# Patient Record
Sex: Male | Born: 1960
Health system: Southern US, Community
[De-identification: ages and names within clinical notes are randomized; demographics above are authoritative.]

## PROBLEM LIST (undated history)

## (undated) DIAGNOSIS — G35D Multiple sclerosis, unspecified: Secondary | ICD-10-CM

## (undated) DIAGNOSIS — F1010 Alcohol abuse, uncomplicated: Secondary | ICD-10-CM

## (undated) DIAGNOSIS — F1910 Other psychoactive substance abuse, uncomplicated: Secondary | ICD-10-CM

## (undated) DIAGNOSIS — G35 Multiple sclerosis: Secondary | ICD-10-CM

## (undated) DIAGNOSIS — G709 Myoneural disorder, unspecified: Secondary | ICD-10-CM

## (undated) HISTORY — DX: Myoneural disorder, unspecified: G70.9

## (undated) HISTORY — PX: BACK SURGERY: SHX140

---

## 2012-10-27 ENCOUNTER — Emergency Department (INDEPENDENT_AMBULATORY_CARE_PROVIDER_SITE_OTHER)
Admission: EM | Admit: 2012-10-27 | Discharge: 2012-10-27 | Disposition: A | Discharge status: Home / Self Care | Payer: Self-pay | Source: Home / Self Care

## 2012-10-27 ENCOUNTER — Encounter (HOSPITAL_COMMUNITY): Payer: Self-pay

## 2012-10-27 NOTE — ED Notes (Signed)
C/o 1 week duration of ED; denies STD concerns or pain

## 2012-10-27 NOTE — ED Provider Notes (Signed)
Medical screening examination/treatment/procedure(s) were performed by non-physician practitioner and as supervising physician I was immediately available for consultation/collaboration.  Raynald Blend, MD 10/27/12 1547

## 2012-10-27 NOTE — ED Provider Notes (Signed)
History     CSN: [id number]  Arrival date & time 10/27/12  1010   None     Chief Complaint  Patient presents with  . Erectile Dysfunction    (Consider location/radiation/quality/duration/timing/severity/associated sxs/prior treatment) HPI Comments: 52 year old male presents with a complaint erectile dysfunction for one week. Is a rather sudden onset with an unknown etiology. A little over a week ago he was experiencing genital pain but that has since abated. He denies urethral discharge, pain, swelling or other urogenital problems. He states that the erectile dysfunction is just affecting his marriage. It is noted that one risk factor is smoking.   History reviewed. No pertinent past medical history.  History reviewed. No pertinent past surgical history.  History reviewed. No pertinent family history.  History  Substance Use Topics  . Smoking status: Not on file  . Smokeless tobacco: Not on file  . Alcohol Use: Not on file      Review of Systems  All other systems reviewed and are negative.    Allergies  Review of patient's allergies indicates no known allergies.  Home Medications  No current outpatient prescriptions on file.  BP 128/74  Pulse 98  Temp(Src) 98.2 F (36.8 C) (Oral)  Resp 20  SpO2 98%  Physical Exam  Constitutional: He is oriented to person, place, and time. He appears well-developed and well-nourished. No distress.  Eyes: EOM are normal.  Neck: Normal range of motion. Neck supple.  Cardiovascular: Normal rate.   Pulmonary/Chest: Effort normal. No respiratory distress.  Neurological: He is alert and oriented to person, place, and time.  Skin: Skin is warm and dry.  Psychiatric: He has a normal mood and affect.    ED Course  Procedures (including critical care time)  Labs Reviewed - No data to display No results found.   1. Male erectile dysfunction       MDM  The patient is discharged in stable condition. Since this condition  started rather suddenly approximately one week ago likely there could be a psychological component to this. In addition he is a smoker and is may also contribute to the etiology of his ED. It is explained that he would need further workup including a more in-depth history, physical exam and possible blood work to determine etiology. I have advised him to obtain a primary care doctor to perform these functions and evaluate his condition and etiology. They also prescribe medications for him as well. We will not be able to prescribe the medications at this time as the risk outweighs the benefits without having the above information and evaluation.         Hayden Rasmussen, NP 10/27/12 1116

## 2016-01-28 ENCOUNTER — Encounter (HOSPITAL_COMMUNITY): Payer: Self-pay | Admitting: Emergency Medicine

## 2016-01-28 DIAGNOSIS — F17210 Nicotine dependence, cigarettes, uncomplicated: Secondary | ICD-10-CM | POA: Insufficient documentation

## 2016-01-28 DIAGNOSIS — F1420 Cocaine dependence, uncomplicated: Secondary | ICD-10-CM | POA: Insufficient documentation

## 2016-01-28 DIAGNOSIS — G35 Multiple sclerosis: Secondary | ICD-10-CM | POA: Insufficient documentation

## 2016-01-28 DIAGNOSIS — Z79899 Other long term (current) drug therapy: Secondary | ICD-10-CM | POA: Insufficient documentation

## 2016-01-28 LAB — CBC
HEMATOCRIT: 42.2 % (ref 39.0–52.0)
HEMOGLOBIN: 13.4 g/dL (ref 13.0–17.0)
MCH: 27.2 pg (ref 26.0–34.0)
MCHC: 31.8 g/dL (ref 30.0–36.0)
MCV: 85.8 fL (ref 78.0–100.0)
Platelets: 321 10*3/uL (ref 150–400)
RBC: 4.92 MIL/uL (ref 4.22–5.81)
RDW: 14.1 % (ref 11.5–15.5)
WBC: 15.5 10*3/uL — AB (ref 4.0–10.5)

## 2016-01-28 LAB — RAPID URINE DRUG SCREEN, HOSP PERFORMED
Amphetamines: NOT DETECTED
Barbiturates: NOT DETECTED
Benzodiazepines: NOT DETECTED
Cocaine: POSITIVE — AB
Opiates: NOT DETECTED
Tetrahydrocannabinol: NOT DETECTED

## 2016-01-28 LAB — COMPREHENSIVE METABOLIC PANEL
ALBUMIN: 3.5 g/dL (ref 3.5–5.0)
ALT: 19 U/L (ref 17–63)
ANION GAP: 7 (ref 5–15)
AST: 27 U/L (ref 15–41)
Alkaline Phosphatase: 56 U/L (ref 38–126)
BUN: 15 mg/dL (ref 6–20)
CHLORIDE: 104 mmol/L (ref 101–111)
CO2: 26 mmol/L (ref 22–32)
Calcium: 9.3 mg/dL (ref 8.9–10.3)
Creatinine, Ser: 1.05 mg/dL (ref 0.61–1.24)
Glucose, Bld: 160 mg/dL — ABNORMAL HIGH (ref 65–99)
POTASSIUM: 3.9 mmol/L (ref 3.5–5.1)
SODIUM: 137 mmol/L (ref 135–145)
Total Bilirubin: 0.3 mg/dL (ref 0.3–1.2)
Total Protein: 6.4 g/dL — ABNORMAL LOW (ref 6.5–8.1)

## 2016-01-28 LAB — ETHANOL

## 2016-01-28 NOTE — ED Notes (Signed)
Pt here for detox off of cocaine. Pt denies SI/HI thoughts. Pts last use today. Pt alert x4. NAD at this time.

## 2016-01-29 ENCOUNTER — Encounter (HOSPITAL_COMMUNITY): Payer: Self-pay | Admitting: Emergency Medicine

## 2016-01-29 ENCOUNTER — Emergency Department (HOSPITAL_COMMUNITY)
Admission: EM | Admit: 2016-01-29 | Discharge: 2016-01-29 | Disposition: A | Discharge status: Home / Self Care | Payer: Self-pay | Attending: Emergency Medicine | Admitting: Emergency Medicine

## 2016-01-29 ENCOUNTER — Ambulatory Visit (HOSPITAL_COMMUNITY): Admission: RE | Admit: 2016-01-29 | Payer: Self-pay | Source: Home / Self Care | Admitting: Psychiatry

## 2016-01-29 DIAGNOSIS — F1420 Cocaine dependence, uncomplicated: Secondary | ICD-10-CM

## 2016-01-29 DIAGNOSIS — F17210 Nicotine dependence, cigarettes, uncomplicated: Secondary | ICD-10-CM | POA: Insufficient documentation

## 2016-01-29 DIAGNOSIS — Z79899 Other long term (current) drug therapy: Secondary | ICD-10-CM | POA: Insufficient documentation

## 2016-01-29 DIAGNOSIS — F1010 Alcohol abuse, uncomplicated: Secondary | ICD-10-CM | POA: Insufficient documentation

## 2016-01-29 DIAGNOSIS — F1410 Cocaine abuse, uncomplicated: Secondary | ICD-10-CM | POA: Insufficient documentation

## 2016-01-29 HISTORY — DX: Alcohol abuse, uncomplicated: F10.10

## 2016-01-29 HISTORY — DX: Multiple sclerosis: G35

## 2016-01-29 HISTORY — DX: Other psychoactive substance abuse, uncomplicated: F19.10

## 2016-01-29 HISTORY — DX: Multiple sclerosis, unspecified: G35.D

## 2016-01-29 MED ORDER — CHLORDIAZEPOXIDE HCL 25 MG PO CAPS: ORAL_CAPSULE | ORAL | Status: DC

## 2016-01-29 NOTE — Discharge Instructions (Signed)
Community Resource Guide Outpatient Counseling/Substance Abuse Adult °The [employer]’s “211” is a great source of information about community services available.  Access by dialing 2-1-1 from anywhere in [place], or by website -  www.nc211.org.  ° °Other Local Resources (Updated 09/2015) ° °Crisis Hotlines °  °Services  ° °  °Area Served  °[facility] • Crisis Hotline, available 24 hours a day, 7 days a week: 800-939-5911 [place], [place]  ° [facility] • Crisis Hotline, available 24 hours a day, 7 days a week: 866-275-9552 [place], [place]  °[facility] • Suicide Prevention Hotline, available 24 hours a day, 7 days a week: 800-273-8255 [place], [place]  °[facility] ° • Crisis Hotline, available 24 hours a day, 7 days a week: 336-676-6840 [place], [place] °  °[facility] • Crisis Hotline, available 24 hours a day, 7 days a week: 800-256-2452 All °  °[facility] • Crisis Hotline, available 24 hours a day, 7 days a week: 877-626-1772 [place]  ° °Other Local Resources (Updated 09/2015) ° °Outpatient Counseling/ Substance Abuse Programs  °Services  ° °  °Address and Phone Number  °[facility] ° • Options include Individual counseling, group counseling, intensive outpatient program (several hours a day, several days a week) °• Offers depression assessments °• Provides methadone maintenance program 336-333-6860 °[address], [address] °[place], [place] [address] °  °[facility] ° • Offers partial hospitalization/day treatment and DUI/DWI programs °• Accepts Medicare, private insurance 336-299-4655 °[address] °[place], [place] [address]  °[facility] ° ° • Services include intensive outpatient program (several hours a day, several days a week), outpatient treatment, DUI/DWI services, family education °• Also has some services specifically for Veterans °• Offers transitional housing   [phone] °[address] °[place], [place] [address] °  °  ° [facility] • Accepts Medicare, private pay, and private insurance 336-272-0855 °[address] °[place], [place] [address]  °[facility] • Services include individual counseling, substance abuse intensive outpatient program (several hours a day, several days a week), day treatment °• Accepts Medicare, Medicaid, private insurance 336-271-5888 °[address] °[place], [place] [address]  °[facility] ° • Offers substance abuse intensive outpatient program (several hours a day, several days a week), partial hospitalization program 336-832-9800 °[address] °[place], [place] [address] ° °336-349-4454 °[address] °[place], [place] [address] ° °[phone] °[address] °[place], [place] [address] ° °[phone] °[address] °[place], [place] [address]  °[facility] • Individual counseling only °• Accepts private insurance only 336-292-1510 °[address] °[place], [place] [address]  °[facility] • Methadone maintenance program 800-805-6989 °[address] °[place], [place] [address]  °[facility] • Walk-In Clinic providing substance abuse and mental health counseling °• Accepts Medicaid, Medicare, private insurance °• Offers sliding scale for uninsured 336-342-8316 °[address] °[place], [place]   °[facility] • Offers individual counseling, and intensive in-home services 336-347-7415 °[address] °[place], [place] [address]  °[facility] • Offers individual counseling, family counseling, group therapy, domestic violence counseling, consumer credit counseling °• Accepts Medicare, Medicaid, private insurance °• Offers sliding scale for uninsured 336-387-6161 °[address] °,  [address] ° °336-889-6161 °[place], [address] °[place],  [address]  °[facility] • Offers individual, family   and group counseling °• 3 locations - [place], [place], and [place] ° 336-899-8800 ° °[address] °[place], [place] [address] ° °[address] °[place], [place] [address] ° °[address] °[place], [place] [address]  °[facility]  ° • Offers psychiatric assessment, 8-week Intensive Outpatient Program (several hours a day, several times a week, daytime or evenings), early recovery group, family Program, medication management °• Private pay or private insurance only 336 -[phone], or  °800-659-3381 °[address] °[place], [place] [address]  °[facility] • Offers individual, couples and family counseling °• Accepts Medicaid, private insurance, and sliding scale for uninsured 336-542-2076 °[address] °[place], [place] [address]  °David Fuller, [facility] • Individual counseling °• Private insurance 336-852-4051 °[address] °[place], [place] [address]  °[facility] ° • Offers assessment, substance abuse treatment, and behavioral health treatment 336-878-6098 °[address] °[place], [place] [address]  °[facility] • Individual counseling °• Accepts private insurance 336-272-1972 °[address] °[place], [place] [address]  ° [facility] • Individual counseling °• Accepts Medicare, private insurance 336-547-1574 °[address] °[place], [place] [address]  °[facility]  ° • Offers intensive outpatient program (several hours a day, several times a week) °• Private pay, private insurance [phone] °[address] °[place], [place]  °[facility] • Individual counseling °• Medicare, private insurance 336-505-9494 °[address] °[place], [place] [address]  °[facility]  ° • Offers intensive outpatient program (several hours a day, several times a week) and partial hospitalization  program 336-794-3550 °[address] °[place], [place] [address]  °Parrish McKinney, MD • Individual counseling 336-282-1251 °[address] °[place], [place] [address]  °[facility] • Offers Christian counseling to individuals, couples, and families °• Accepts Medicare and private insurance; offers sliding scale for uninsured 336-288-1484 °[address] °[place], [place] [address]  °[facility] • Christian counseling 336-542-2060 °[address] °[place], [place] [address]  °[facility] ° • Offers crisis counseling, individual counseling, group therapy, in-home therapy, domestic violence services, day treatment, DWI services, Community Support Team (CST), Assertive Community Treatment Team (ACTT), substance abuse Intensive Outpatient Program (several hours a day, several times a week) °• 2 locations - [place] and [place] [phone] °[address] °[place], [place] [address] ° °336-694-1777 °[address] °[place], [place] [address]  °[facility]  ° ° • Individual counseling and group therapy °• Accepts private insurance, Medicare, Medicaid 336-379-7146 °[address] °[place], [place]  °[facility] • Offers individual and family counseling °• Offers LGBTQ services °• Accepts private insurance and private pay 336-288-9190 °[address] °[place], [place] [address]  °[facility]  ° • Offers individual counseling, group therapy, and outpatient detox °• Accepts private insurance 336-389-1413 °[address] °[place], [place]  °[facility] • Individual counseling °• Accepts Medicare, private insurance 336-632-3505 °[address] °[place], [place] [address]  °[facility] • Individual counseling °• Accepts Medicare, private insurance 336-570-0104 °[address] °, [place] [address]  °[facility] ° • Offers substance abuse  Intensive Outpatient Program (several hours a day, several times a week) [phone], or °[phone] °, [place]  ° °

## 2016-01-29 NOTE — Discharge Instructions (Signed)
Finding Treatment for Addiction WHAT IS ADDICTION? Addiction is a complex disease of the brain. It causes an uncontrollable (compulsive) need for a substance. You can be addicted to alcohol, illegal drugs, or prescription medicines such as painkillers. Addiction can also be a behavior, like gambling or shopping. The need for the drug or activity can become so strong that you think about it all the time. You can also become physically dependent on a substance. Addiction can change the way your brain works. Because of these changes, getting more of whatever you are addicted to becomes the most important thing to you and feels better than other activities or relationships. Addiction can lead to changes in health, behavior, emotions, relationships, and choices that affect you and everyone around you. HOW DO I KNOW IF I NEED TREATMENT FOR ADDICTION? Addiction is a progressive disease. Without treatment, addiction can get worse. Living with addiction puts you at higher risk for injury, poor health, lost employment, loss of money, and even death. You might need treatment for addiction if:  You have tried to stop or cut down, but you cannot.  Your addiction is causing physical health problems.  You find it annoying that your friends and family are concerned about your alcohol or substance use.  You feel guilty about substance abuse or a compulsive behavior.  You have lied or tried to hide your addiction.  You need a particular substance or activity to start your day or to calm down.  You are getting in trouble at school, work, home, or with the police.  You have done something illegal to support your addiction.  You are running out of money because of your addiction.  You have no time for anything other than your addiction. WHAT TYPES OF TREATMENT ARE AVAILABLE? The treatment program that is right for you will depend on many factors, including the type of addiction you have. Treatment programs  can be outpatient or inpatient. In an outpatient program, you live at home and go to work or school, but you also go to a clinic for treatment. With an inpatient program, you live and sleep at the program facility during treatment. After treatment, you might need a plan for support during recovery. Other treatment options include:   Medicine.  Some addictions may be treated with prescription medicines.  You might also need medicine to treat anxiety or depression.  Counseling and behavior therapy. Therapy can help individuals and families behave in healthier ways and relate more effectively.  Support groups. Confidential group therapy, such as a 12-step program, can help individuals and families during treatment and recovery. No single type of program is right for everyone. Many treatment programs involve a combination of education, counseling, and a 12-step, spiritually-based approach. Some treatment programs are government sponsored. They are geared for patients who do not have private insurance. Treatment programs can vary in many respects, such as:  Cost and types of insurance that are accepted.  Types of on-site medical services that are offered.  Length of stay, setting, and size.  Overall philosophy of treatment. WHAT SHOULD I CONSIDER WHEN SELECTING A TREATMENT PROGRAM? It is important to think about your individual requirements when selecting a treatment program. There are a number of things to consider, such as:  If the program is certified by the appropriate government agency. Even private programs must be certified and employ certified professionals.  If the program is covered by your insurance. If finances are a concern, the first call you should make  is to [employer], if you have health insurance. Ask for a list of treatment programs that are in your network, and confirm any copayments and deductibles that you may have to pay.  If you do not have insurance, or if  you choose to attend a program that does not accept your insurance, discuss whether a payment plan can be set up.  If treatment is available in languages other than English, if needed.  If the program offers detoxification treatment, if needed.  If 12-step meetings are held at the center or if transport is available for patients to attend meetings at other locations.  If the program is professional, organized, and clean.  If the program meets all of your needs, including physical and cultural needs.  If the facility offers specific treatment for your particular addiction.  If support continues to be offered after you have left the program.  If your treatment plan is continually looked at to make sure you are receiving the right treatment at the right time.  If mental health counseling is part of your treatment.  If medicine is included in treatment, if needed.  If your family is included in your treatment plan and if support is offered to them throughout the treatment process.  How the treatment works to prevent relapse. WHERE ELSE CAN I GET HELP?  Your health care provider. Ask him or her to help you find addiction treatment. These discussions are confidential.  The [employer] on Alcoholism and Drug Dependence (NCADD). This group has information about treatment centers and programs for people who have an addiction and for family members.  The telephone number is 1-800-NCA-CALL (219-875-7242).  The website is https://ncadd.org/about-ncadd/our-affiliates  The Substance Abuse and Mental Health Services Administration [facility]). This group will help you find publicly funded treatment centers, help hotlines, and counseling services near you.  The telephone number is 1-800-662-HELP ((336) 470-9655).  The website is www.findtreatment.RockToxic.pl In countries outside of the [place]. and [place], look in [employer] for contact information for services in your area.   This  information is not intended to replace advice given to you by your health care provider. Make sure you discuss any questions you have with your health care provider.   Document Released: 07/24/2005 Document Revised: 05/16/2015 Document Reviewed: 06/13/2014 Elsevier Interactive Patient Education 2016 [employer]. Substance Abuse Treatment Programs  Intensive Outpatient Programs [facility] Services     [address]      [place], [place]                   [phone]       [facility] [address] [place] [address] [place], [place] [phone]  [facility]     (Inpatient and outpatient)     [address]           [phone]    [facility] [phone] (Suboxone and Methadone)  [address]      [place], [place] [address]      [phone]       [address] [place], [place] [phone]  [facility] (Outpatient/Inpatient, [facility])    (insurance only) [phone]             [facility] (Groups & Residential) [place], [place] [phone]     [facility]     [address]     [place], [place]      [phone]  [facility] (for caregivers and family) [address] [place], [place] [phone]      Residential Treatment Programs [facility]      [address] [place], [place] [address]  [phone]       [facility] [address] [place], [place] [address] [phone]  [facility] [place]        [phone]       [facility] [phone]  [facility] ([facility]             [address]                                         [place], [place]                                                [phone] or [phone]                               [facility] [address] [place], [address] [phone]  [facility]    [address]      [place],  [place]     [phone]       [facility] [address] [place], [place] [phone]  [facility]   [address] [place], [place] [address]     [phone]      Admissions: 8am-3pm M-F  [facility] ([facility]) [address] [place], [place] [phone]  [facility]: Residential Program [phone] Days)   [place], [place]      [phone] or [phone]     [facility] [place], [place] (Walk in Hours over the weekend or by referral)  [facility] [address] [place], [place], [place] [address] [phone]  Crisis Mobile: [facility]:  [phone] (for crisis response 24 hours a day) [facility] Hotline:      [phone] Outpatient Psychiatry and Counseling  [facility]: Mobile Crisis Management 24 hours:  [phone]  [facility] of the [employer] sliding scale fee and walk in schedule: M-F 8am-12pm/1pm-3pm [address]  [place], [place] [address] [phone]  [facility] [address] [place], [place] [address] [phone]  [facility] ([facility] [employer])- new patient walk-in appointments available Monday - Friday 8am -3pm.          [address] [place], [place] [address] [phone] or crisis line- [phone]  [facility] Services/ Intensive Outpatient Therapy Program [address] [place], [place] [address] [phone]  [facility]                  Crisis Services      [phone] [address]     [place], [place] [address]                 [facility]   [facility] [phone]. [address] [place], [place] [address]   [facility] of Care          [address],  [place], [place] [address]       [phone]  [facility] [address] [place], [place] [address] [phone]  [facility]    [address]    [place], [place] [address]     [phone]       Andee Poles, MD     [address]     [place] [address]     [phone]       [facility] [address] [place] [address]  [facility]     [address] [place], [place]      [phone]       [facility] Eulogio Ditch, MD [address] [place], [place] [address] [phone]  [facility]     [address]     [place], [place] [address]     [phone]       [facility] [address] [place], [place] [address] [phone] Resources for Temporary Residential Assistance/Crisis Centers  DAY CENTERS [facility]) M-F 8am-3pm   [address] [place], [place] [address]   [phone] Services include: laundry, barbering, support groups, case management, phone  & computer access, showers, AA/NA mtgs, mental health/substance abuse nurse, job skills class, disability information, VA assistance, spiritual classes, etc.   HOMELESS SHELTERS  [facility]     [facility]   [address] [place]     [phone]              [employer] (women and children)       [address] [place], [place] [address] [phone] [email or web address] for application and process Application Required  [facility]   [address]    [place] [address]     [phone]                    [facility] [address] [place], [place] [address] [phone] application appt.) Application Required  [facility] (women only)    [address]     [place], [place] [address]     [phone]      Intake starts 6pm daily Need valid ID, SSC, & Police report [employer] [address] [place], [place] [phone] Application Required  [employer] (men only)     [address]      [place], [place]     [phone]       [facility] (Pregnant women only) [address] [place], [place] [phone]  [facility]      [address]      [place], [place] [address]     [phone]             [facility] [address] [place], [place] [phone] 90 day commitment/SA/Application process  [facility](men only)     [address]     [place], [place]     [phone]       Check-in at [facility] of [facility] [address] [place], [place] [address] [phone] Men/Women/Women and Children must be there by 7 pm  [facility] [place], [place] [phone]  Stimulant Use Disorder-Cocaine Cocaine is one of a group of powerful drugs called stimulants. Cocaine has medical uses for stopping nosebleeds and for pain control before minor nose or dental surgery. However, cocaine is misused because of the effects that it produces. These effects include:   A feeling of extreme pleasure.  Alertness.  High energy. Common street names for cocaine include coke, crack, blow, snow, and nose candy. Cocaine is snorted, dissolved in water and injected, or smoked.  Stimulants are addictive because they activate regions of the brain that produce both the pleasurable sensation of "reward" and psychological dependence. Together, these actions account for loss of control and the rapid development of drug dependence. This means you become ill without the drug (withdrawal) and need to keep using it to function.  Stimulant use disorder is use of stimulants that disrupts your daily life. It disrupts relationships with family and friends and how you do your job. Cocaine increases your blood pressure and heart rate. It can cause a heart  attack or stroke. Cocaine can also cause death from irregular heart rate or seizures. SYMPTOMS Symptoms of stimulant use disorder with cocaine include:  Use of cocaine in larger amounts or over a longer period of time than intended.  Unsuccessful attempts to cut down or control cocaine use.  A lot of time spent obtaining, using, or recovering from the effects of cocaine.  A strong desire or urge to use cocaine (craving).  Continued use of cocaine in spite of major problems at work, school, or home because of use.  Continued use of cocaine in spite of relationship problems because of use.  Giving up or cutting down on important life activities because of cocaine use.  Use of cocaine over and over in situations when it is physically hazardous, such as driving a car.  Continued use of cocaine in spite of a physical problem that is likely related to use. Physical problems can include:  Malnutrition.  Nosebleeds.  Chest pain.  High blood pressure.  A hole that develops between the part of your nose that separates your nostrils (perforated nasal septum).  Lung and kidney damage.  Continued use of cocaine in spite of a mental problem that is likely related to use. Mental problems can include:  Schizophrenia-like symptoms.  Depression.  Bipolar mood swings.  Anxiety.  Sleep problems.  Need to use more and more cocaine to get the same effect, or lessened effect over time with use of the same amount of cocaine (tolerance).  Having withdrawal symptoms when cocaine use is stopped, or using cocaine to reduce or avoid withdrawal symptoms. Withdrawal symptoms include:  Depressed or irritable mood.  Low energy or restlessness.  Bad dreams.  Poor or excessive sleep.  Increased appetite. DIAGNOSIS Stimulant use disorder is diagnosed by your health care provider. You may be asked questions about your cocaine use and how it affects your life. A physical exam may be done. A  drug screen may be ordered. You may be referred to a mental health professional. The diagnosis of stimulant use disorder requires at least two symptoms within 12 months. The type of stimulant use disorder depends on the number of signs and symptoms you have. The type may be:  Mild. Two or three signs and symptoms.  Moderate. Four or five signs and symptoms.  Severe. Six or more signs and symptoms. TREATMENT Treatment for stimulant use disorder is usually provided by mental health professionals with training in substance use disorders. The following options are  available:  Counseling or talk therapy. Talk therapy addresses the reasons you use cocaine and ways to keep you from using again. Goals of talk therapy include:  Identifying and avoiding triggers for use.  Handling cravings.  Replacing use with healthy activities.  Support groups. Support groups provide emotional support, advice, and guidance.  Medicine. Certain medicines may decrease cocaine cravings or withdrawal symptoms. HOME CARE INSTRUCTIONS  Take medicines only as directed by your health care provider.  Identify the people and activities that trigger your cocaine use and avoid them.  Keep all follow-up visits as directed by your health care provider. SEEK MEDICAL CARE IF:  Your symptoms get worse or you relapse.  You are not able to take medicines as directed. SEEK IMMEDIATE MEDICAL CARE IF:  You have serious thoughts about hurting yourself or others.  You have a seizure, chest pain, sudden weakness, or loss of speech or vision. FOR MORE INFORMATION  National Institute on Drug Abuse: http://www.price-smith.com/  Substance Abuse and Mental Health Services Administration: SkateOasis.com.pt   This information is not intended to replace advice given to you by your health care provider. Make sure you discuss any questions you have with your health care provider.   Document Released: 08/22/2000 Document Revised: 09/15/2014  Document Reviewed: 09/07/2013 Elsevier Interactive Patient Education [employer]  Results for orders placed or performed during the hospital encounter of 01/29/16  Comprehensive metabolic panel  Result Value Ref Range   Sodium 137 135 - 145 mmol/L   Potassium 3.9 3.5 - 5.1 mmol/L   Chloride 104 101 - 111 mmol/L   CO2 26 22 - 32 mmol/L   Glucose, Bld 160 (H) 65 - 99 mg/dL   BUN 15 6 - 20 mg/dL   Creatinine, Ser 1.61 0.61 - 1.24 mg/dL   Calcium 9.3 8.9 - 09.6 mg/dL   Total Protein 6.4 (L) 6.5 - 8.1 g/dL   Albumin 3.5 3.5 - 5.0 g/dL   AST 27 15 - 41 U/L   ALT 19 17 - 63 U/L   Alkaline Phosphatase 56 38 - 126 U/L   Total Bilirubin 0.3 0.3 - 1.2 mg/dL   GFR calc non Af Amer >60 >60 mL/min   GFR calc Af Amer >60 >60 mL/min   Anion gap 7 5 - 15  Ethanol  Result Value Ref Range   Alcohol, Ethyl (B) <5 <5 mg/dL  cbc  Result Value Ref Range   WBC 15.5 (H) 4.0 - 10.5 K/uL   RBC 4.92 4.22 - 5.81 MIL/uL   Hemoglobin 13.4 13.0 - 17.0 g/dL   HCT 04.5 40.9 - 81.1 %   MCV 85.8 78.0 - 100.0 fL   MCH 27.2 26.0 - 34.0 pg   MCHC 31.8 30.0 - 36.0 g/dL   RDW 91.4 78.2 - 95.6 %   Platelets 321 150 - 400 K/uL  Rapid urine drug screen (hospital performed)  Result Value Ref Range   Opiates NONE DETECTED NONE DETECTED   Cocaine POSITIVE (A) NONE DETECTED   Benzodiazepines NONE DETECTED NONE DETECTED   Amphetamines NONE DETECTED NONE DETECTED   Tetrahydrocannabinol NONE DETECTED NONE DETECTED   Barbiturates NONE DETECTED NONE DETECTED

## 2016-01-29 NOTE — ED Provider Notes (Signed)
CSN: [id number]     Arrival date & time 01/29/16  0957 History   First MD Initiated Contact with Patient 01/29/16 1114     Chief Complaint  Patient presents with  . Alcohol Problem  . Addiction Problem    pt is requesting detox      HPI Patient presents to the emergency department requesting detox from alcohol and cocaine.  He states his had long-term use of both.  His last use of both was yesterday.  No homicidal or suicidal thoughts.  No nausea vomiting.  Denies abdominal pain.  No other complaints.  Requesting detox.   Past Medical History  Diagnosis Date  . Multiple sclerosis (HCC)   . Substance abuse   . Alcohol abuse    History reviewed. No pertinent past surgical history. No family history on file. Social History  Substance Use Topics  . Smoking status: Current Every Day Smoker -- 0.50 packs/day    Types: Cigarettes  . Smokeless tobacco: None  . Alcohol Use: Yes    Review of Systems  All other systems reviewed and are negative.     Allergies  Review of patient's allergies indicates no known allergies.  Home Medications   Prior to Admission medications   Medication Sig Start Date End Date Taking? Authorizing Provider  amitriptyline (ELAVIL) 150 MG tablet Take 150 mg by mouth at bedtime.    Historical Provider, MD  baclofen (LIORESAL) 10 MG tablet Take 15 mg by mouth 2 (two) times daily.    Historical Provider, MD  chlordiazePOXIDE (LIBRIUM) 25 MG capsule  PO TID x 1D, then 25-50mg  PO BID X 1D, then 25-50mg  PO QD X 1D 01/29/16   Azalia Bilis, MD  Interferon Beta-1a (REBIF REBIDOSE TITRATION PACK) [phone] & 6X22 MCG SOAJ Inject 8.8 mcg into the skin See admin instructions. 3 times a week for 2 weeks then 22 mcg three times a week for 2 weeks    Historical Provider, MD  traZODone (DESYREL) 50 MG tablet Take 50 mg by mouth at bedtime as needed for sleep.    Historical Provider, MD   BP 134/73 mmHg  Pulse 64  Temp(Src) 98 F (36.7 C) (Oral)  Resp 18  SpO2  99% Physical Exam  Constitutional: He is oriented to person, place, and time. He appears well-developed and well-nourished.  HENT:  Head: Normocephalic.  Eyes: EOM are normal.  Neck: Normal range of motion.  Pulmonary/Chest: Effort normal.  Abdominal: He exhibits no distension.  Musculoskeletal: Normal range of motion.  Neurological: He is alert and oriented to person, place, and time.  Psychiatric: He has a normal mood and affect.  Nursing note and vitals reviewed.   ED Course  Procedures (including critical care time) Labs Review Labs Reviewed - No data to display  Imaging Review No results found. I have personally reviewed and evaluated these images and lab results as part of my medical decision-making.   EKG Interpretation None      MDM   Final diagnoses:  Cocaine abuse  Alcohol abuse    Vital signs are normal.  No signs of acute withdrawal.  Outpatient Librium and outpatient resources given.  Discharged home safely from the emergency department.  Medical screening examination completed.    Azalia Bilis, MD 01/29/16 1131

## 2016-01-29 NOTE — ED Notes (Addendum)
Pt is requesting detoxification from alcohol and cocaine.Stated last used at 0900 yesterday. Given information at [facility] this am. Pt stated that he was told to come to [facility] for detox.Pt is alert, oriented and cooperative

## 2016-01-29 NOTE — ED Notes (Addendum)
EDP at bedside. Pt stated that he will call [facility]

## 2016-01-29 NOTE — ED Notes (Signed)
Patient reports that he uses cocaine daily. Denies IV drug use. Reports that he has MS, and wants to detox from cocaine use.

## 2016-01-29 NOTE — ED Provider Notes (Signed)
CSN: [id number]     Arrival date & time 01/28/16  1555 History   First MD Initiated Contact with Patient 01/29/16 0147     Chief Complaint  Patient presents with  . Drug Problem     (Consider location/radiation/quality/duration/timing/severity/associated sxs/prior Treatment) HPI Comments: Patient presents for assistance with cocaine dependency. He denies being suicidal or homicidal. No hallucinations. He states he has never tried to quit before and feels he needs help or he will continue to use. No physical complaints.   Patient is a 55 y.o. male presenting with drug problem. The history is provided by the patient. No language interpreter was used.  Drug Problem This is a chronic problem. Pertinent negatives include no chills or fever.    Past Medical History  Diagnosis Date  . Multiple sclerosis (HCC)    No past surgical history on file. No family history on file. Social History  Substance Use Topics  . Smoking status: Current Every Day Smoker -- 0.50 packs/day    Types: Cigarettes  . Smokeless tobacco: None  . Alcohol Use: Yes    Review of Systems  Constitutional: Negative for fever and chills.  HENT: Negative.   Respiratory: Negative.   Cardiovascular: Negative.   Gastrointestinal: Negative.   Musculoskeletal: Negative.   Skin: Negative.   Neurological: Negative.       Allergies  Review of patient's allergies indicates no known allergies.  Home Medications   Prior to Admission medications   Medication Sig Start Date End Date Taking? Authorizing Provider  amitriptyline (ELAVIL) 150 MG tablet Take 150 mg by mouth at bedtime.   Yes Historical Provider, MD  baclofen (LIORESAL) 10 MG tablet Take 15 mg by mouth 2 (two) times daily.   Yes Historical Provider, MD  traZODone (DESYREL) 50 MG tablet Take 50 mg by mouth at bedtime as needed for sleep.   Yes Historical Provider, MD  Interferon Beta-1a (REBIF REBIDOSE TITRATION PACK) 6X8.8 & 6X22 MCG SOAJ Inject 8.8 mcg  into the skin See admin instructions. 3 times a week for 2 weeks then 22 mcg three times a week for 2 weeks    Historical Provider, MD   BP 105/69 mmHg  Pulse 76  Temp(Src) 98.3 F (36.8 C) (Oral)  Resp 20  SpO2 97% Physical Exam  Constitutional: He is oriented to person, place, and time. He appears well-developed and well-nourished.  HENT:  Head: Normocephalic.  Neck: Normal range of motion. Neck supple.  Cardiovascular: Normal rate and regular rhythm.   Pulmonary/Chest: Effort normal and breath sounds normal.  Abdominal: Soft. Bowel sounds are normal. There is no tenderness. There is no rebound and no guarding.  Musculoskeletal: Normal range of motion.  Neurological: He is alert and oriented to person, place, and time.  Skin: Skin is warm and dry. No rash noted.  Psychiatric: He has a normal mood and affect.    ED Course  Procedures (including critical care time) Labs Review Labs Reviewed  COMPREHENSIVE METABOLIC PANEL - Abnormal; Notable for the following:    Glucose, Bld 160 (*)    Total Protein 6.4 (*)    All other components within normal limits  CBC - Abnormal; Notable for the following:    WBC 15.5 (*)    All other components within normal limits  URINE RAPID DRUG SCREEN, HOSP PERFORMED - Abnormal; Notable for the following:    Cocaine POSITIVE (*)    All other components within normal limits  ETHANOL    Imaging Review No results found.  I have personally reviewed and evaluated these images and lab results as part of my medical decision-making.   EKG Interpretation None      MDM   Final diagnoses:  None    1. Cocaine dependence 2. History of MS  The patient presents for assistance in the treatment of cocaine dependence. He is not homicidal or suicidal. He reports he was sent here from [facility] for inpatient treatment. The patient was advised that there is no medical necessity for detox from cocaine. Called [facility] to discuss admission on behalf of the  patient. Admissions are not available until 8:00 am and no bed guaranteed. Patient advised. He will be able to board in ED until morning and will follow up with [facility] in the morning for possible admission for inpatient treatment.     Elpidio Anis, PA-C 01/29/16 3500  Derwood Kaplan, MD 01/29/16 [phone]

## 2016-03-05 DIAGNOSIS — G35D Multiple sclerosis, unspecified: Secondary | ICD-10-CM

## 2016-03-05 DIAGNOSIS — Z5900 Homelessness unspecified: Secondary | ICD-10-CM

## 2016-03-05 DIAGNOSIS — F32A Depression, unspecified: Secondary | ICD-10-CM

## 2016-03-05 DIAGNOSIS — F329 Major depressive disorder, single episode, unspecified: Secondary | ICD-10-CM

## 2016-03-05 DIAGNOSIS — G35 Multiple sclerosis: Secondary | ICD-10-CM

## 2016-03-06 NOTE — Congregational Nurse Program (Signed)
Congregational Nurse Program Note  Date of Encounter: 03/05/2016  Past Medical History: Past Medical History  Diagnosis Date  . Multiple sclerosis (HCC)   . Substance abuse   . Alcohol abuse     Encounter Details:     CNP Questionnaire - 03/06/16 0216    Patient Demographics   Is this a new or existing patient? New   Patient is considered a/an Not Applicable   Race African-American/Black   Patient Assistance   Location of Patient Assistance Not Applicable   Patient's financial/insurance status Self-Pay   Uninsured Patient Yes   Interventions Counseled to make appt. with provider   Patient referred to apply for the following financial assistance [facility];Medicaid   Transportation assistance Yes   Type of Assistance Bus Pass Given   Assistance securing medications No   Educational health offerings Chronic disease;Navigating the healthcare system;Interpersonal relationships;Behavioral health;Medications   Encounter Details   Primary purpose of visit Chronic Illness/Condition Visit;Education/Health Concerns;Spiritual Care/Support Visit;Navigating the Healthcare System   Was an Emergency Department visit averted? Not Applicable   Does patient have a medical provider? No   Patient referred to Establish PCP   Was a mental health screening completed? (GAINS tool) No   Does patient have dental issues? No   Does patient have vision issues? No   Does your patient have an abnormal blood pressure today? No   Since previous encounter, have you referred patient for abnormal blood pressure that resulted in a new diagnosis or medication change? No   Does your patient have an abnormal blood glucose today? No   Since previous encounter, have you referred patient for abnormal blood glucose that resulted in a new diagnosis or medication change? No   Was there a life-saving intervention made? No    Initial  Visit  For this  55 year old  Man ,stating he has MS  And is seen in  [place] for his  Speciality care. Was diagnosed  1 year ago he states and that has changed his life , states he speaks to groups  About what he has gone through and God  Still has him here. States he tried one time to commit suicide but God  Saved him .  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  Has a 55 year old  Soon , is still on probation ,has on a ankle monitor.  Has difficulty getting back  And forth to see  Specialist in [facility] ,would  Like to be referred to one in town.  Medications  For  MS through  MS support  Group, attends MS sessions to learn all about  Condition att a local church in town , nurse looked up support  Groups  And gave client another resource to contact. Referral made to obtain a primary care MD, is seen at  [facility] for therapy as he said having  MS  Has made him depressed. Nurse listed and offered support as needed. Has knowledge of his  Condition and is resourceful . Client feels some days he is unable to get up  And may need rest ,and when taking his medication needs a snack. Nurse will talk with case manager.  Client takes Elavil 150 mg  At  Night, Baclofen  10 mg  , Trazone   50 mg, Amitriptyline  HCL  50 mg  3  at night.. Client is  From [facility] , has lived in [place]  About  8 years. States  MS  Has orders him cooling  Vest  For  Pain.  Has eye  Problems  ,blurred  Vision.. Referral to [facility] for  Primary  Care, apply  For orange  Card, return to see nurse next week for follow  Up.  Bus  Tickets  Given to  Go to [employer]

## 2016-03-12 DIAGNOSIS — G35 Multiple sclerosis: Secondary | ICD-10-CM

## 2016-03-12 DIAGNOSIS — F329 Major depressive disorder, single episode, unspecified: Secondary | ICD-10-CM

## 2016-03-12 DIAGNOSIS — Z5900 Homelessness unspecified: Secondary | ICD-10-CM

## 2016-03-12 DIAGNOSIS — F32A Depression, unspecified: Secondary | ICD-10-CM

## 2016-03-12 NOTE — Congregational Nurse Program (Signed)
Congregational Nurse Program Note  Date of Encounter: 03/12/2016  Past Medical History: Past Medical History  Diagnosis Date  . Multiple sclerosis (HCC)   . Substance abuse   . Alcohol abuse     Encounter Details:     CNP Questionnaire - 03/12/16 1800    Patient Demographics   Is this a new or existing patient? Existing   Patient is considered a/an Not Applicable   Race African-American/Black   Patient Assistance   Location of Patient Assistance Not Applicable   Patient's financial/insurance status Self-Pay   Uninsured Patient Yes   Interventions Follow-up/Education/Support provided after completed appt.;Counseled to make appt. with provider   Patient referred to apply for the following financial assistance [facility]   Food insecurities addressed Not Applicable   Transportation assistance No   Assistance securing medications No   Educational health offerings Chronic disease;Medications;Navigating the healthcare system;Exercise/physical activity   Encounter Details   Primary purpose of visit Education/Health Concerns;Spiritual Care/Support Visit;Navigating the Healthcare System   Was an Emergency Department visit averted? Not Applicable   Does patient have a medical provider? Yes   Patient referred to Follow up with established PCP   Was a mental health screening completed? (GAINS tool) No   Does patient have dental issues? No   Does patient have vision issues? No   Was a vision referral made? No   Does your patient have an abnormal blood pressure today? No   Since previous encounter, have you referred patient for abnormal blood pressure that resulted in a new diagnosis or medication change? No   Does your patient have an abnormal blood glucose today? No   Since previous encounter, have you referred patient for abnormal blood glucose that resulted in a new diagnosis or medication change? No   Was there a life-saving intervention made? No    Client in today   After  Identifying  His  Primary  Care . [facility]  Will see  Client for  His  Primary  Care  Needs .Marland Kitchen A copy  Of a letter   From his  SW was placed in is  file. Appointment  For  Primary  Care  Is  04-16-16.  Client will  Not  Need to  Seek appointment  At  [facility], explained to  Client. Given  Places to  Go  An apply  For  [facility] card.  Will  Need  A letter  Stating he  Lives  Here . Will get  Case management  To  Print.

## 2016-04-22 DIAGNOSIS — G35 Multiple sclerosis: Secondary | ICD-10-CM

## 2016-04-22 DIAGNOSIS — Z5900 Homelessness unspecified: Secondary | ICD-10-CM

## 2016-04-22 NOTE — Congregational Nurse Program (Signed)
Congregational Nurse Program Note  Date of Encounter: 04/22/2016  Past Medical History: Past Medical History:  Diagnosis Date  . Alcohol abuse   . Multiple sclerosis (HCC)   . Substance abuse     Encounter Details:     CNP Questionnaire - 04/22/16 1923      Patient Demographics   Is this a new or existing patient? Existing   Patient is considered a/an Not Applicable   Race African-American/Black     Patient Assistance   Location of Patient Assistance Not Applicable   Patient's financial/insurance status Self-Pay   Uninsured Patient Yes   Interventions Follow-up/Education/Support provided after completed appt.   Patient referred to apply for the following financial assistance Not Applicable   Food insecurities addressed Not Applicable   Transportation assistance Yes   Type of Assistance Bus Pass Given   Educational health offerings Chronic disease     Encounter Details   Primary purpose of visit Spiritual Care/Support Visit;Education/Health Concerns   Was an Emergency Department visit averted? Not Applicable   Does patient have a medical provider? Yes   Patient referred to Follow up with established PCP   Was a mental health screening completed? (GAINS tool) No   Does patient have dental issues? No   Does patient have vision issues? No   Does your patient have an abnormal blood pressure today? No   Since previous encounter, have you referred patient for abnormal blood pressure that resulted in a new diagnosis or medication change? No   Does your patient have an abnormal blood glucose today? No   Since previous encounter, have you referred patient for abnormal blood glucose that resulted in a new diagnosis or medication change? No   Was there a life-saving intervention made? No     Client being discharged today to his apartment,brother assisting him with his move. Client states he ahs been blessed. Has two upcoming MD appointments. Ask if he could  Get bus passes and  nurse gave him passes for appointments.and wished him well.

## 2016-08-28 ENCOUNTER — Encounter (HOSPITAL_COMMUNITY): Payer: Self-pay | Admitting: Emergency Medicine

## 2016-08-28 ENCOUNTER — Ambulatory Visit (INDEPENDENT_AMBULATORY_CARE_PROVIDER_SITE_OTHER): Payer: Self-pay

## 2016-08-28 ENCOUNTER — Ambulatory Visit (HOSPITAL_COMMUNITY)
Admission: EM | Admit: 2016-08-28 | Discharge: 2016-08-28 | Disposition: A | Discharge status: Home / Self Care | Payer: Self-pay | Attending: Emergency Medicine | Admitting: Emergency Medicine

## 2016-08-28 DIAGNOSIS — M545 Low back pain, unspecified: Secondary | ICD-10-CM

## 2016-08-28 DIAGNOSIS — S300XXA Contusion of lower back and pelvis, initial encounter: Secondary | ICD-10-CM

## 2016-08-28 MED ORDER — KETOROLAC TROMETHAMINE 30 MG/ML IJ SOLN
30.0000 mg | Freq: Once | INTRAMUSCULAR | Status: AC
  Administered 2016-08-28: 30 mg via INTRAMUSCULAR

## 2016-08-28 MED ORDER — DEXAMETHASONE SODIUM PHOSPHATE 10 MG/ML IJ SOLN: 8.0000 mg | Freq: Once | INTRAMUSCULAR | Status: DC

## 2016-08-28 MED ORDER — DEXAMETHASONE SODIUM PHOSPHATE 10 MG/ML IJ SOLN
INTRAMUSCULAR | Status: AC
  Filled 2016-08-28: qty 1

## 2016-08-28 MED ORDER — DEXAMETHASONE SODIUM PHOSPHATE 10 MG/ML IJ SOLN
10.0000 mg | Freq: Once | INTRAMUSCULAR | Status: AC
  Administered 2016-08-28: 10 mg via INTRAMUSCULAR

## 2016-08-28 MED ORDER — CYCLOBENZAPRINE HCL 10 MG PO TABS: 10.0000 mg | ORAL_TABLET | Freq: Two times a day (BID) | ORAL | 0 refills | Status: DC | PRN

## 2016-08-28 MED ORDER — PREDNISONE 10 MG PO TABS: 20.0000 mg | ORAL_TABLET | Freq: Every day | ORAL | 0 refills | Status: DC

## 2016-08-28 MED ORDER — KETOROLAC TROMETHAMINE 30 MG/ML IJ SOLN
INTRAMUSCULAR | Status: AC
  Filled 2016-08-28: qty 1

## 2016-08-28 MED FILL — CYCLOBENZAPRINE 10 MG TAB: 10 | 10 days supply | Qty: 20 | Fill #0

## 2016-08-28 MED FILL — ?PREDNISONE 10 MG TABLET: 10 | 5 days supply | Qty: 10 | Fill #0

## 2016-08-28 NOTE — ED Triage Notes (Signed)
Pt reports he fell today going up stairs at home today around 1155 and inj his back  States hx of MS... Slow gait; cane present upon arrival  States he hit back of his head when he fell but denies LOC  Pt is sitting down crouched on a 15 degree angle... States he can not straighten up due to MS  Brother was able to help him quickly and bring him here  A&O x4... Talking in complete sentences... NAD

## 2016-08-28 NOTE — ED Provider Notes (Signed)
CSN: [id number]     Arrival date & time 08/28/16  1319 History   None    Chief Complaint  Patient presents with  . Fall  . Back Pain   (Consider location/radiation/quality/duration/timing/severity/associated sxs/prior Treatment) HPI: 55 yo male with H/O MS presented with CC of lower back pain secondary to fall and hitting on wooden steps. Pt leading forward, unable to sit up at 90 degrees secondary to pain.  No visible swelling/hematoma to scalp.  Pt A&Ox3. Speech clear. Answered all questions appropriately.  Pt has poor gait and co ordination due to MS and uses cane to walk. Past Medical History:  Diagnosis Date  . Alcohol abuse   . Multiple sclerosis (HCC)   . Substance abuse    History reviewed. No pertinent surgical history. History reviewed. No pertinent family history. Social History  Substance Use Topics  . Smoking status: Current Every Day Smoker    Packs/day: 0.50    Types: Cigarettes  . Smokeless tobacco: Never Used  . Alcohol use Yes    Review of Systems  Musculoskeletal: Positive for back pain and gait problem.    Allergies  Patient has no known allergies.  Home Medications   Prior to Admission medications   Medication Sig Start Date End Date Taking? Authorizing Provider  Interferon Beta-1a (REBIF REBIDOSE TITRATION PACK) 6X8.8 & 6X22 MCG SOAJ Inject 8.8 mcg into the skin See admin instructions. 3 times a week for 2 weeks then 22 mcg three times a week for 2 weeks   Yes Historical Provider, MD  amitriptyline (ELAVIL) 150 MG tablet Take 150 mg by mouth at bedtime.    Historical Provider, MD  baclofen (LIORESAL) 10 MG tablet Take 15 mg by mouth 2 (two) times daily.    Historical Provider, MD  chlordiazePOXIDE (LIBRIUM) 25 MG capsule 50mg  PO TID x 1D, then 25-50mg  PO BID X 1D, then 25-50mg  PO QD X 1D 01/29/16   Azalia BilisKevin Campos, MD  traZODone (DESYREL) 50 MG tablet Take 50 mg by mouth at bedtime as needed for sleep.    Historical Provider, MD   Meds Ordered and  Administered this Visit   Medications  ketorolac (TORADOL) 30 MG/ML injection 30 mg (30 mg Intramuscular Given 08/28/16 1406)  dexamethasone (DECADRON) injection 10 mg (10 mg Intramuscular Given 08/28/16 1406)    BP 157/93 (BP Location: Left Arm)   Pulse 75   Temp 98.1 F (36.7 C) (Oral)   Resp 20   SpO2 96%  No data found.   Physical Exam  Constitutional: He is oriented to person, place, and time.  Musculoskeletal: He exhibits no edema or deformity.  Neurological: He is alert and oriented to person, place, and time.  Skin: Skin is warm.  Pain to lower back. Extreme tenderness to palpation. Decrease ROM in LS. Paraspinuous and vertebral tenderness to palpation. No visible swelling/bruising appreciated. No visible swelling/bruising/abrasion noted to scalp. Urgent Care Course   Clinical Course     Procedures (including critical care time)  Labs Review Labs Reviewed - No data to display  Imaging Review No results found.   Visual Acuity Review  Right Eye Distance:   Left Eye Distance:   Bilateral Distance:    Right Eye Near:   Left Eye Near:    Bilateral Near:         MDM   1. Acute bilateral low back pain without sciatica   2. Contusion of lower back, initial encounter    XR Lower back: Negative. Findings discussed with  patient.  Bending stretching exercises as tolerated.  Heating pad to lower back as directed. Take medication as recommended. FU PCP as needed.    Linnaea Ahn, NP 08/28/16 1506

## 2017-04-14 DIAGNOSIS — G35 Multiple sclerosis: Secondary | ICD-10-CM | POA: Insufficient documentation

## 2017-06-15 DIAGNOSIS — R7611 Nonspecific reaction to tuberculin skin test without active tuberculosis: Secondary | ICD-10-CM | POA: Insufficient documentation

## 2019-02-16 ENCOUNTER — Emergency Department (HOSPITAL_COMMUNITY)
Admission: EM | Admit: 2019-02-16 | Discharge: 2019-02-16 | Disposition: A | Discharge status: Home / Self Care | Payer: HRSA Program | Attending: Emergency Medicine | Admitting: Emergency Medicine

## 2019-02-16 ENCOUNTER — Encounter (HOSPITAL_COMMUNITY): Payer: Self-pay

## 2019-02-16 ENCOUNTER — Emergency Department (HOSPITAL_COMMUNITY): Payer: HRSA Program

## 2019-02-16 ENCOUNTER — Other Ambulatory Visit: Payer: Self-pay

## 2019-02-16 DIAGNOSIS — G35 Multiple sclerosis: Secondary | ICD-10-CM | POA: Insufficient documentation

## 2019-02-16 DIAGNOSIS — B349 Viral infection, unspecified: Secondary | ICD-10-CM | POA: Diagnosis not present

## 2019-02-16 DIAGNOSIS — Z20828 Contact with and (suspected) exposure to other viral communicable diseases: Secondary | ICD-10-CM | POA: Insufficient documentation

## 2019-02-16 DIAGNOSIS — F17210 Nicotine dependence, cigarettes, uncomplicated: Secondary | ICD-10-CM | POA: Insufficient documentation

## 2019-02-16 DIAGNOSIS — Z79899 Other long term (current) drug therapy: Secondary | ICD-10-CM | POA: Diagnosis not present

## 2019-02-16 DIAGNOSIS — R509 Fever, unspecified: Secondary | ICD-10-CM | POA: Diagnosis present

## 2019-02-16 MED ORDER — BENZONATATE 100 MG PO CAPS: 100.0000 mg | ORAL_CAPSULE | Freq: Three times a day (TID) | ORAL | 0 refills | Status: DC | PRN

## 2019-02-16 NOTE — Discharge Instructions (Addendum)
Your chest x-ray was normal.  We have sent off a COVID-19 test.  You will receive a call phone call with results.  Persons with covid or under investigation are to [place] themselves for 14 days. You may be able to discontinue self quarantine if the following conditions are met:   Persons with COVID-19 who have symptoms and were directed to care for themselves at home may discontinue home isolation under the  following conditions: - It has been at least 7 days have passed since symptoms first appeared. - AND at least 3 days (72 hours) have passed since recovery defined as resolution of fever without the use of fever-reducing medications and improvement in respiratory symptoms (e.g., cough, shortness of breath)  Please follow the below quarantine instructions.   We are sending you with a prescription for Tessalon to take every 8 hours as needed as needed for cough  We have prescribed you new medication(s) today. Discuss the medications prescribed today with your pharmacist as they can have adverse effects and interactions with your other medicines including over the counter and prescribed medications. Seek medical evaluation if you start to experience new or abnormal symptoms after taking one of these medicines, seek care immediately if you start to experience difficulty breathing, feeling of your throat closing, facial swelling, or rash as these could be indications of a more serious allergic reaction   Please follow up with primary care within 3-5 days for re-evaluation- call prior to going to the office to make them aware of your symptoms. Return to the ER for new or worsening symptoms including but not limited to increased work of breathing, fever, chest pain, passing out, or any other concerns.       Person Under Monitoring Name: Tanner Camacho  Location: [address] [place] [address]   Infection Prevention Recommendations for Individuals Confirmed to have, or Being  Evaluated for, 2019 Novel Coronavirus (COVID-19) Infection Who Receive Care at Home  Individuals who are confirmed to have, or are being evaluated for, COVID-19 should follow the prevention steps below until a healthcare provider or local or state health department says they can return to normal activities.  Stay home except to get medical care You should restrict activities outside your home, except for getting medical care. Do not go to work, school, or public areas, and do not use public transportation or taxis.  Call ahead before visiting your doctor Before your medical appointment, call the healthcare provider and tell them that you have, or are being evaluated for, COVID-19 infection. This will help the healthcare providers office take steps to keep other people from getting infected. Ask your healthcare provider to call the local or state health department.  Monitor your symptoms Seek prompt medical attention if your illness is worsening (e.g., difficulty breathing). Before going to your medical appointment, call the healthcare provider and tell them that you have, or are being evaluated for, COVID-19 infection. Ask your healthcare provider to call the local or state health department.  Wear a facemask You should wear a facemask that covers your nose and mouth when you are in the same room with other people and when you visit a healthcare provider. People who live with or visit you should also wear a facemask while they are in the same room with you.  Separate yourself from other people in your home As much as possible, you should stay in a different room from other people in your home. Also, you should use  a separate bathroom, if available.  Avoid sharing household items You should not share dishes, drinking glasses, cups, eating utensils, towels, bedding, or other items with other people in your home. After using these items, you should wash them thoroughly with soap and  water.  Cover your coughs and sneezes Cover your mouth and nose with a tissue when you cough or sneeze, or you can cough or sneeze into your sleeve. Throw used tissues in a lined trash can, and immediately wash your hands with soap and water for at least 20 seconds or use an alcohol-based hand rub.  Wash your [facility] your hands often and thoroughly with soap and water for at least 20 seconds. You can use an alcohol-based hand sanitizer if soap and water are not available and if your hands are not visibly dirty. Avoid touching your eyes, nose, and mouth with unwashed hands.   Prevention Steps for Caregivers and Household Members of Individuals Confirmed to have, or Being Evaluated for, COVID-19 Infection Being Cared for in the Home  If you live with, or provide care at home for, a person confirmed to have, or being evaluated for, COVID-19 infection please follow these guidelines to prevent infection:  Follow healthcare providers instructions Make sure that you understand and can help the patient follow any healthcare provider instructions for all care.  Provide for the patients basic needs You should help the patient with basic needs in the home and provide support for getting groceries, prescriptions, and other personal needs.  Monitor the patients symptoms If they are getting sicker, call his or her medical provider and tell them that the patient has, or is being evaluated for, COVID-19 infection. This will help the healthcare providers office take steps to keep other people from getting infected. Ask the healthcare provider to call the local or state health department.  Limit the number of people who have contact with the patient If possible, have only one caregiver for the patient. Other household members should stay in another home or place of residence. If this is not possible, they should stay in another room, or be separated from the patient as much as possible. Use a  separate bathroom, if available. Restrict visitors who do not have an essential need to be in the home.  Keep older adults, very young children, and other sick people away from the patient Keep older adults, very young children, and those who have compromised immune systems or chronic health conditions away from the patient. This includes people with chronic heart, lung, or kidney conditions, diabetes, and cancer.  Ensure good ventilation Make sure that shared spaces in the home have good air flow, such as from an air conditioner or an opened window, weather permitting.  Wash your hands often Wash your hands often and thoroughly with soap and water for at least 20 seconds. You can use an alcohol based hand sanitizer if soap and water are not available and if your hands are not visibly dirty. Avoid touching your eyes, nose, and mouth with unwashed hands. Use disposable paper towels to dry your hands. If not available, use dedicated cloth towels and replace them when they become wet.  Wear a facemask and gloves Wear a disposable facemask at all times in the room and gloves when you touch or have contact with the patients blood, body fluids, and/or secretions or excretions, such as sweat, saliva, sputum, nasal mucus, vomit, urine, or feces.  Ensure the mask fits over your nose and mouth tightly,  and do not touch it during use. Throw out disposable facemasks and gloves after using them. Do not reuse. Wash your hands immediately after removing your facemask and gloves. If your personal clothing becomes contaminated, carefully remove clothing and launder. Wash your hands after handling contaminated clothing. Place all used disposable facemasks, gloves, and other waste in a lined container before disposing them with other household waste. Remove gloves and wash your hands immediately after handling these items.  Do not share dishes, glasses, or other household items with the patient Avoid sharing  household items. You should not share dishes, drinking glasses, cups, eating utensils, towels, bedding, or other items with a patient who is confirmed to have, or being evaluated for, COVID-19 infection. After the person uses these items, you should wash them thoroughly with soap and water.  Wash laundry thoroughly Immediately remove and wash clothes or bedding that have blood, body fluids, and/or secretions or excretions, such as sweat, saliva, sputum, nasal mucus, vomit, urine, or feces, on them. Wear gloves when handling laundry from the patient. Read and follow directions on labels of laundry or clothing items and detergent. In general, wash and dry with the warmest temperatures recommended on the label.  Clean all areas the individual has used often Clean all touchable surfaces, such as counters, tabletops, doorknobs, bathroom fixtures, toilets, phones, keyboards, tablets, and bedside tables, every day. Also, clean any surfaces that may have blood, body fluids, and/or secretions or excretions on them. Wear gloves when cleaning surfaces the patient has come in contact with. Use a diluted bleach solution (e.g., dilute bleach with 1 part bleach and 10 parts water) or a household disinfectant with a label that says EPA-registered for coronaviruses. To make a bleach solution at home, add 1 tablespoon of bleach to 1 quart (4 cups) of water. For a larger supply, add  cup of bleach to 1 gallon (16 cups) of water. Read labels of cleaning products and follow recommendations provided on product labels. Labels contain instructions for safe and effective use of the cleaning product including precautions you should take when applying the product, such as wearing gloves or eye protection and making sure you have good ventilation during use of the product. Remove gloves and wash hands immediately after cleaning.  Monitor yourself for signs and symptoms of illness Caregivers and household members are considered  close contacts, should monitor their health, and will be asked to limit movement outside of the home to the extent possible. Follow the monitoring steps for close contacts listed on the symptom monitoring form.   ? If you have additional questions, contact your local health department or call the epidemiologist on call at [phone] (available 24/7). ? This guidance is subject to change. For the most up-to-date guidance from [facility], please refer to their website: [email or web address]

## 2019-02-16 NOTE — ED Triage Notes (Signed)
Pt reports loss of taste and smell since this morning along with generalized body aches, pt denies CP, or SOB, mild cough. Dx with COVID-19 in April

## 2019-02-16 NOTE — ED Provider Notes (Signed)
[facility] EMERGENCY DEPARTMENT Provider Note   CSN: [id number] Arrival date & time: 02/16/19  1551    History   Chief Complaint Chief Complaint  Patient presents with  . Loss of taste and smell  . Generalized Body Aches    HPI Tanner Camacho is a 58 y.o. male w/ a hx of tobacco abuse, EtoH abuse, & MS who presents who presents to the emergency department with concern for COVID-19.  Patient states that this morning he developed loss of taste/smell, subjective fever, generalized body aches, congestion, and dry cough.  No alleviating or aggravating factors.  No intervention prior to arrival.  Has had some loose stools but this is not a new problem, no melena or hematochezia.  He states his symptoms feel exactly the same as when he had COVID-19 back in mid April of this year.  He was recently incarcerated, had COVID 41 while he was in prison, states that he was released this morning.  The prison did have a very large COVID outbreak.  He is not sure if he is been in direct contact with anyone that has been positive recently.  Denies ear pain, sore throat, chest pain, dyspnea, significant abdominal pain, nausea, or vomiting. This does not feel similar to problems/flares w/ his MS, no acute numbness/weakness.      HPI  Past Medical History:  Diagnosis Date  . Alcohol abuse   . Multiple sclerosis ([place])   . Substance abuse ([place])     There are no active problems to display for this patient.   History reviewed. No pertinent surgical history.      Home Medications    Prior to Admission medications   Medication Sig Start Date End Date Taking? Authorizing Provider  cyclobenzaprine (FLEXERIL) 10 MG tablet Take 1 tablet (10 mg total) by mouth 2 (two) times daily as needed for muscle spasms. 08/28/16   Multani, Bhupinder, NP  Interferon Beta-1a (REBIF REBIDOSE TITRATION PACK) 6X8.8 & 6X22 MCG SOAJ Inject 8.8 mcg into the skin See admin instructions. 3 times a week for 2  weeks then 22 mcg three times a week for 2 weeks    [provider]  predniSONE (DELTASONE) 10 MG tablet Take 2 tablets (20 mg total) by mouth daily. 08/28/16   Multani, Bhupinder, NP    Family History No family history on file.  Social History Social History   Tobacco Use  . Smoking status: Current Every Day Smoker    Packs/day: 0.50    Types: Cigarettes  . Smokeless tobacco: Never Used  Substance Use Topics  . Alcohol use: Yes  . Drug use: Yes    Types: Cocaine     Allergies   Patient has no known allergies.   Review of Systems Review of Systems  Constitutional: Positive for fever (subjective).  HENT: Positive for congestion. Negative for ear pain and sore throat.   Respiratory: Positive for cough. Negative for shortness of breath.   Cardiovascular: Negative for chest pain.  Gastrointestinal: Positive for diarrhea (loose stools, no acute change). Negative for abdominal pain, blood in stool, constipation, nausea and vomiting.  Genitourinary: Negative for dysuria.  Musculoskeletal: Positive for myalgias.  Neurological: Negative for weakness and numbness.  All other systems reviewed and are negative.    Physical Exam Updated Vital Signs BP (!) 176/107   Pulse 92   Temp 97.7 F (36.5 C) (Oral)   Resp 16   SpO2 98%   Physical Exam Vitals signs and nursing note  reviewed.  Constitutional:      General: He is not in acute distress.    Appearance: He is well-developed. He is not toxic-appearing.  HENT:     Head: Normocephalic and atraumatic.     Right Ear: Tympanic membrane, ear canal and external ear normal. Tympanic membrane is not perforated, erythematous, retracted or bulging.     Left Ear: Tympanic membrane, ear canal and external ear normal. Tympanic membrane is not perforated, erythematous, retracted or bulging.     Nose: Congestion present.     Right Sinus: No maxillary sinus tenderness or frontal sinus tenderness.     Left Sinus: No maxillary  sinus tenderness or frontal sinus tenderness.     Mouth/Throat:     Mouth: Mucous membranes are moist.     Pharynx: Oropharynx is clear. Uvula midline. No oropharyngeal exudate or posterior oropharyngeal erythema.     Comments: Posterior oropharynx is symmetric appearing. Patient tolerating own secretions without difficulty. No trismus. No drooling. No hot potato voice. No swelling beneath the tongue, submandibular compartment is soft.   Eyes:     General:        Right eye: No discharge.        Left eye: No discharge.     Conjunctiva/sclera: Conjunctivae normal.  Neck:     Musculoskeletal: Neck supple. No neck rigidity.  Cardiovascular:     Rate and Rhythm: Normal rate and regular rhythm.  Pulmonary:     Effort: Pulmonary effort is normal. No respiratory distress.     Breath sounds: Normal breath sounds. No wheezing, rhonchi or rales.  Abdominal:     General: There is no distension.     Palpations: Abdomen is soft.     Tenderness: There is no abdominal tenderness. There is no guarding or rebound.  Musculoskeletal:     Comments: Intact active range of motion throughout without palpable bony tenderness.  compartments are soft.  Lymphadenopathy:     Cervical: No cervical adenopathy.  Skin:    General: Skin is warm and dry.     Findings: No rash.  Neurological:     Mental Status: He is alert.     Comments: Clear speech. Ambulatory.   Psychiatric:        Behavior: Behavior normal.     ED Treatments / Results  Labs (all labs ordered are listed, but only abnormal results are displayed) Labs Reviewed  NOVEL CORONAVIRUS, NAA (HOSPITAL ORDER, SEND-OUT TO REF LAB)    EKG None  Radiology Dg Chest Portable 1 View  Result Date: 02/16/2019 CLINICAL DATA:  Cough. Loss of taste and smell. Diagnosed with COVID-19 in April. EXAM: PORTABLE CHEST 1 VIEW COMPARISON:  None. FINDINGS: The cardiac silhouette is borderline enlarged. The lungs are clear. No pleural effusion or pneumothorax is  identified. No acute osseous abnormality is seen. IMPRESSION: No active disease. Electronically Signed   By: Sebastian AcheAllen  Grady M.D.   On: 02/16/2019 16:47    Procedures Procedures (including critical care time)  Medications Ordered in ED Medications - No data to display   Initial Impression / Assessment and Plan / ED Course  I have reviewed the triage vital signs and the nursing notes.  Pertinent labs & imaging results that were available during my care of the patient were reviewed by me and considered in my medical decision making (see chart for details).    Patient presents with sxs that seem consistent w/ a viral illness. Patient is nontoxic appearing, in no apparent distress, vitals are  WNL other than elevated BP which will need PCP recheck, doubt HTN emergency. Patient is afebrile in the ED, lungs are CTA, CXR negative for infiltrate, doubt pneumonia. There is no wheezing or signs of respiratory distress. Sxs onset < 7 days, afebrile here, no sinus tenderness, doubt acute bacterial sinusitis. Centor score 0, doubt strep pharyngitis. No evidence of AOM on exam. No meningeal signs. Abdomen nontender w/o peritoneal signs. Sxs not similar to prior MS flares, denies acute numbness/weakness. Suspect viral, possibly covid 19 as he has had this previously & sxs feel the same, also recently incarcerated, will send [facility] send out test. Quarantine precautions discussed. Tessalon prescription for cough. PCP follow up. I discussed results, treatment plan, need for PCP follow-up, and return precautions with the patient. Provided opportunity for questions, patient confirmed understanding and is in agreement with plan.   Tanner Camacho was evaluated in Emergency Department on 02/16/2019 for the symptoms described in the history of present illness. He/she was evaluated in the context of the global COVID-19 pandemic, which necessitated consideration that the patient might be at risk for infection with the  SARS-CoV-2 virus that causes COVID-19. Institutional protocols and algorithms that pertain to the evaluation of patients at risk for COVID-19 are in a state of rapid change based on information released by regulatory bodies including the CDC and federal and state organizations. These policies and algorithms were followed during the patient's care in the ED.   Final Clinical Impressions(s) / ED Diagnoses   Final diagnoses:  Viral illness    ED Discharge Orders         Ordered    benzonatate (TESSALON) 100 MG capsule  3 times daily PRN     02/16/19 1729           Cherly Andersonetrucelli, Arinze Rivadeneira R, PA-C 02/16/19 1736    Maia PlanLong, Joshua G, MD 02/17/19 1149

## 2019-02-17 LAB — NOVEL CORONAVIRUS, NAA (HOSP ORDER, SEND-OUT TO REF LAB; TAT 18-24 HRS): SARS-CoV-2, NAA: NOT DETECTED

## 2019-04-12 ENCOUNTER — Encounter: Payer: Self-pay | Admitting: Neurology

## 2019-04-15 ENCOUNTER — Other Ambulatory Visit: Payer: Self-pay

## 2019-04-15 DIAGNOSIS — Z20822 Contact with and (suspected) exposure to covid-19: Secondary | ICD-10-CM

## 2019-04-16 LAB — NOVEL CORONAVIRUS, NAA: SARS-CoV-2, NAA: NOT DETECTED

## 2019-05-12 NOTE — Progress Notes (Signed)
NEUROLOGY CONSULTATION NOTE  Tanner EuropeCleveland Cerritos MRN: [id number] DOB: September 21, 1960  Referring provider: Valerie RoysNykedtra Martin Brown, FNP Primary care provider: Lavinia SharpsMary Ann Placey, NP  Reason for consult:  Multiple sclerosis  HISTORY OF PRESENT ILLNESS: Tanner Camacho is a 58 year old right-handed African American male who presents to establish care for multiple sclerosis.  History supplemented by prior neurologist notes.  In 2014, he developed full body pain with numbness and tingling in the lower back radiating down the legs.  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  He did not undergo lumbar puncture.  No history of optic neuritis  He has never had an acute MS flare requiring IV steroids.  Vision:  Occasional blurriness for a second Motor:  Sometimes his legs give out while walking Sensory:  Numbness and tingling in feet and hands. Pain:  Whole body pain Gait:  Cannot walk long due to pain and legs sometimes give out.  Uses a cane. Bowel/Bladder:  Sometimes difficulty voiding urine.  Constipation. Fatigue:  All of the time.  Insomnia. Cognition:  Sometimes has word-finding difficulty.  Loses train of thought. Mood:  Depressed.  Current DMT:  Aubagio (started 06/2018 until 02/2019 because he ran out of it) Prior DMT:  Rebif, Avonex (01/2015-09/2015; discontinued after release from correctional facility. Restarted 04/2017 to 10/2017 stopped due to depression); Tecfidera (11/2017 to 01/2018; discontinued due to headaches, flushing, stomach pain, gas).    Other medications:  Diclofenac 50mg ; trazodone 100mg ; gabapentin 300mg  TID.  Past medications:  Flexeril.  MRIs: 11/04/2018 MRI of brain with and without contrast: Slight interval progression of white matter lesions compared to prior MRI from 10/09/2017, such as slightly more confluent left periatrial white matter signal.  No enhancing lesions. 10/09/2017 MRI of  cervical spine with and without contrast; previously identified white matter lesions in upper cervical cord at C2 and C4 from prior MRI on 04/08/2017 are not identified on current study.  Unchanged bilateral lesions at C5-6.  Stable increased signal in the right lateral cord at T2.  No new or enhancing lesions. 10/09/2017 MRI of thoracic spine with and without contrast: Patchy increased T2 signal at multiple levels in the thoracic spinal cord with focal lesions at T1-2, T3, T7 and T8.  Focus of apparent enhancement in the cord favored to be artifactual. 04/08/2017 MRI of brain with and without contrast.  Overall stable number of multiple white matter foci compared to prior study from 12/14/2014.  No new or enhancing lesions. 04/08/2017 MRI of cervical spine with and without contrast: New T2 nonenhancing lesions at C2 and C4 when compared to prior study from 12/14/2014.  Unchanged T2 hyperintense lesion in the central cord at C5-C6 level.  Family history:  Cousin had MS and CVA.  LABS: 04/14/17: ACE 38, ANA 1: 80, RF negative, RPR negative, Lyme negative, TSH 3.351 10/09/2014.  RF negative, B12 293, ANCA negative   PAST MEDICAL HISTORY: Past Medical History:  Diagnosis Date  . Alcohol abuse   . Multiple sclerosis (HCC)   . Substance abuse (HCC)     PAST SURGICAL HISTORY: Past Surgical History:  Procedure Laterality Date  . BACK SURGERY     cyst removed from back     MEDICATIONS: Current Outpatient Medications on File Prior to Visit  Medication Sig Dispense Refill  . benzonatate (TESSALON) 100 MG capsule Take 1 capsule (100 mg total) by mouth 3 (three) times daily as  needed for cough. 21 capsule 0  . cyclobenzaprine (FLEXERIL) 10 MG tablet Take 1 tablet (10 mg total) by mouth 2 (two) times daily as needed for muscle spasms. 20 tablet 0  . Interferon Beta-1a (REBIF REBIDOSE TITRATION PACK) 6X8.8 & 6X22 MCG SOAJ Inject 8.8 mcg into the skin See admin instructions. 3 times a week for 2 weeks then 22 mcg  three times a week for 2 weeks    . predniSONE (DELTASONE) 10 MG tablet Take 2 tablets (20 mg total) by mouth daily. 10 tablet 0   No current facility-administered medications on file prior to visit.     ALLERGIES: No Known Allergies  FAMILY HISTORY: Family History  Problem Relation Age of Onset  . Dementia Mother   . Multiple sclerosis Cousin     SOCIAL HISTORY: Social History   Socioeconomic History  . Marital status: Single    Spouse name: Not on file  . Number of children: Not on file  . Years of education: Not on file  . Highest education level: Not on file  Occupational History  . Not on file  Social Needs  . Financial resource strain: Not on file  . Food insecurity    Worry: Not on file    Inability: Not on file  . Transportation needs    Medical: Not on file    Non-medical: Not on file  Tobacco Use  . Smoking status: Current Every Day Smoker    Packs/day: 0.50    Types: Cigarettes  . Smokeless tobacco: Never Used  Substance and Sexual Activity  . Alcohol use: Yes  . Drug use: Yes    Types: Cocaine  . Sexual activity: Not on file  Lifestyle  . Physical activity    Days per week: Not on file    Minutes per session: Not on file  . Stress: Not on file  Relationships  . Social Herbalist on phone: Not on file    Gets together: Not on file    Attends religious service: Not on file    Active member of club or organization: Not on file    Attends meetings of clubs or organizations: Not on file    Relationship status: Not on file  . Intimate partner violence    Fear of current or ex partner: Not on file    Emotionally abused: Not on file    Physically abused: Not on file    Forced sexual activity: Not on file  Other Topics Concern  . Not on file  Social History Narrative  . Not on file    REVIEW OF SYSTEMS: Constitutional: No fevers, chills, or sweats, no generalized fatigue, change in appetite Eyes: No visual changes, double vision, eye  pain Ear, nose and throat: No hearing loss, ear pain, nasal congestion, sore throat Cardiovascular: No chest pain, palpitations Respiratory:  No shortness of breath at rest or with exertion, wheezes GastrointestinaI: No nausea, vomiting, diarrhea, abdominal pain, fecal incontinence Genitourinary:  Neurogenic bladder Musculoskeletal:  Neck pain, back pain Integumentary: No rash, pruritus, skin lesions Neurological: as above Psychiatric: depression, anxiety Endocrine: No palpitations, fatigue, diaphoresis, mood swings, change in appetite, change in weight, increased thirst Hematologic/Lymphatic:  No purpura, petechiae. Allergic/Immunologic: no itchy/runny eyes, nasal congestion, recent allergic reactions, rashes  PHYSICAL EXAM: Blood pressure (!) 155/91, pulse 67, temperature 98.4 F (36.9 C), height 6' (1.829 m), weight 196 lb (88.9 kg), SpO2 98 %. General: No acute distress.  Patient appears well-groomed.  Head:  Normocephalic/atraumatic Eyes:  fundi examined but not visualized Neck: supple, no paraspinal tenderness, full range of motion Back: No paraspinal tenderness Heart: regular rate and rhythm Lungs: Clear to auscultation bilaterally. Vascular: No carotid bruits. Neurological Exam: Mental status: alert and oriented to person, place, and time, recent and remote memory intact, fund of knowledge intact, attention and concentration intact, speech fluent and not dysarthric, language intact. Cranial nerves: CN I: not tested CN II: pupils equal, round and reactive to light, visual fields intact CN III, IV, VI:  full range of motion, no nystagmus, no ptosis CN V: facial sensation intact CN VII: upper and lower face symmetric CN VIII: hearing intact CN IX, X: gag intact, uvula midline CN XI: sternocleidomastoid and trapezius muscles intact CN XII: tongue midline Bulk & Tone: normal, no fasciculations. Motor:  5-/5 throughout, but limited due to pain. Sensation:  Patchy decreased  pinprick sensation in feet; vibration sensation intact. Deep Tendon Reflexes:  2+ throughout, toes equivocal Finger to nose testing:  Without dysmetria.   Heel to shin:  Without dysmetria.   Gait:  Antalgic gait (left leg pain), using cane.  Unable to assess Romberg as patient requires cane to stand.  IMPRESSION: Multiple sclerosis Chronic pain Neurogenic bladder Depression  PLAN: 1.  Restart Aubagio 14mg  daily 2.  Increase gabapentin to 600mg  three times daily 3.  Trazodone 100mg  at bedtime 4.  Check new baseline MRI brain/cervical spine with and without contrast 5.  Check CBC with diff, CMP and vitamin D level today and again in 6 months 6.  Refer to urology for neurogenic bladder 7.  He would like a transport chair.  We will see what we can do 8.  Follow up in 6 months (about a week after repeat labs)  Thank you for allowing me to take part in the care of this patient.  Shon MilletAdam Carena Stream, DO  CC: Valerie RoysNykedtra Martin Brown, FNP

## 2019-05-13 ENCOUNTER — Other Ambulatory Visit (INDEPENDENT_AMBULATORY_CARE_PROVIDER_SITE_OTHER): Payer: Medicaid Other

## 2019-05-13 ENCOUNTER — Ambulatory Visit (INDEPENDENT_AMBULATORY_CARE_PROVIDER_SITE_OTHER): Payer: Medicaid Other | Admitting: Neurology

## 2019-05-13 ENCOUNTER — Encounter: Payer: Self-pay | Admitting: Neurology

## 2019-05-13 ENCOUNTER — Other Ambulatory Visit: Payer: Self-pay

## 2019-05-13 VITALS — BP 155/91 | HR 67 | Temp 98.4°F | Ht 72.0 in | Wt 196.0 lb

## 2019-05-13 DIAGNOSIS — G8929 Other chronic pain: Secondary | ICD-10-CM

## 2019-05-13 DIAGNOSIS — N319 Neuromuscular dysfunction of bladder, unspecified: Secondary | ICD-10-CM

## 2019-05-13 DIAGNOSIS — G35 Multiple sclerosis: Secondary | ICD-10-CM

## 2019-05-13 DIAGNOSIS — F329 Major depressive disorder, single episode, unspecified: Secondary | ICD-10-CM | POA: Diagnosis not present

## 2019-05-13 DIAGNOSIS — F32A Depression, unspecified: Secondary | ICD-10-CM

## 2019-05-13 LAB — COMPREHENSIVE METABOLIC PANEL
ALT: 15 U/L (ref 0–53)
AST: 16 U/L (ref 0–37)
Albumin: 4.4 g/dL (ref 3.5–5.2)
Alkaline Phosphatase: 78 U/L (ref 39–117)
BUN: 14 mg/dL (ref 6–23)
CO2: 34 mEq/L — ABNORMAL HIGH (ref 19–32)
Calcium: 9.7 mg/dL (ref 8.4–10.5)
Chloride: 101 mEq/L (ref 96–112)
Creatinine, Ser: 0.97 mg/dL (ref 0.40–1.50)
GFR: 96.17 mL/min (ref >60.00)
Glucose, Bld: 81 mg/dL (ref 70–99)
Potassium: 4.2 mEq/L (ref 3.5–5.1)
Sodium: 141 mEq/L (ref 135–145)
Total Bilirubin: 0.2 mg/dL (ref 0.2–1.2)
Total Protein: 7.8 g/dL (ref 6.0–8.3)

## 2019-05-13 LAB — VITAMIN D 25 HYDROXY (VIT D DEFICIENCY, FRACTURES): VITD: 32.49 ng/mL (ref 30.00–100.00)

## 2019-05-13 MED ORDER — GABAPENTIN 300 MG PO CAPS: 600.0000 mg | ORAL_CAPSULE | Freq: Three times a day (TID) | ORAL | 5 refills | Status: DC

## 2019-05-13 NOTE — Patient Instructions (Addendum)
1.  Restart Aubagio 2.  Will increase gabapentin to 2 capsules three times daily  3.  Will repeat MRI of brain and cervical spine with and without contrast 4.  Check labs:  CBC with diff, CMP and vitamin D.  Repeat same labs in 6 months. 5.  Refer to urology for neurogenic bladder. 6.  Will try to order transport chair 7.  Follow up in 6 months (about a week after repeat labs)

## 2019-05-14 LAB — CBC WITH DIFFERENTIAL
Basophils Absolute: 0.1 10*3/uL (ref 0.0–0.2)
Basos: 1 %
EOS (ABSOLUTE): 0.1 10*3/uL (ref 0.0–0.4)
Eos: 1 %
Hematocrit: 45.5 % (ref 37.5–51.0)
Hemoglobin: 14.6 g/dL (ref 13.0–17.7)
Immature Grans (Abs): 0 10*3/uL (ref 0.0–0.1)
Immature Granulocytes: 0 %
Lymphocytes Absolute: 2.3 10*3/uL (ref 0.7–3.1)
Lymphs: 24 %
MCH: 26.9 pg (ref 26.6–33.0)
MCHC: 32.1 g/dL (ref 31.5–35.7)
MCV: 84 fL (ref 79–97)
Monocytes Absolute: 0.6 10*3/uL (ref 0.1–0.9)
Monocytes: 6 %
Neutrophils Absolute: 6.3 10*3/uL (ref 1.4–7.0)
Neutrophils: 68 %
RBC: 5.42 x10E6/uL (ref 4.14–5.80)
RDW: 13.2 % (ref 11.6–15.4)
WBC: 9.3 10*3/uL (ref 3.4–10.8)

## 2019-05-17 ENCOUNTER — Telehealth: Payer: Self-pay | Admitting: Neurology

## 2019-05-17 ENCOUNTER — Telehealth: Payer: Self-pay | Admitting: *Deleted

## 2019-05-17 MED ORDER — AMBULATORY NON FORMULARY MEDICATION: 0 refills | Status: DC

## 2019-05-17 NOTE — Telephone Encounter (Signed)
Pt needs [place] to send a start form for the Aubagio to the [place] society @ [phone] for his medication  He states that they already have him in the system and just need that he does not have to sign anything for them

## 2019-05-17 NOTE — Telephone Encounter (Addendum)
Orders entered for patient prior to 11/11/19 appt for Dr. Tomi Likens. He is aware to come in week prior for lab work. All orders for labs in 6 mos entered.  Script for "transfer chair" written and signed by MD and will be mailed to address that patient requested I send it to : [address] [place]  [address].  Will pass on info to Laureen Ochs regarding patient needing a start form sent for the Aubagio [phone] (prior call today from patient)

## 2019-05-17 NOTE — Addendum Note (Signed)
Addended by: Jesse Fall on: 05/17/2019 04:47 PM   Modules accepted: Orders

## 2019-05-17 NOTE — Telephone Encounter (Addendum)
  Patient currently without a phone - listed one went to his brother and he will have patient return call tomorrow regarding labs below.    ----- Message from Pieter Partridge, DO sent at 05/17/2019  3:24 PM EDT ----- Labs look okay.  Vitamin D is a little low.  I would like him to start over the counter D3 2000 IU daily.

## 2019-05-18 ENCOUNTER — Telehealth: Payer: Self-pay

## 2019-05-18 ENCOUNTER — Telehealth: Payer: Self-pay | Admitting: Neurology

## 2019-05-18 NOTE — Telephone Encounter (Signed)
Patient notified of labs.   

## 2019-05-18 NOTE — Telephone Encounter (Signed)
Patient Assistance form printed from [place] and passed on to Jonna Clark, Therapist, sports.

## 2019-05-18 NOTE — Telephone Encounter (Signed)
I do not see a MS society enrollment form for this medication in [facility] files nor in patient chart scan in. Called patient he states that he gets all his MS medication from this [place] he will call office back with contact number so our office can call for forms.  This is not a PA request

## 2019-05-18 NOTE — Telephone Encounter (Signed)
-----   Message from Pieter Partridge, DO sent at 05/17/2019  3:24 PM EDT ----- Labs look okay.  Vitamin D is a little low.  I would like him to start over the counter D3 2000 IU daily.

## 2019-05-18 NOTE — Telephone Encounter (Signed)
Patient returned the call and provided the number to the local [place]: [phone].

## 2019-05-18 NOTE — Telephone Encounter (Signed)
Patient wants to check the status of [place] faxing the start from to the [place] please call him and let him know he states that is the only way he can get his medication Aubagio. Please call

## 2019-05-23 ENCOUNTER — Telehealth: Payer: Self-pay | Admitting: Neurology

## 2019-05-23 NOTE — Telephone Encounter (Signed)
Patient stated he is returning a call to Dr. Georgie Chard nurse.

## 2019-05-23 NOTE — Telephone Encounter (Signed)
Patient called and said [facility] needs information about why the patient needs the chair.   [phone] for [facility]

## 2019-05-23 NOTE — Telephone Encounter (Signed)
Script per patient request was sent to him for a transport chair (type of w/c). Patient called medical supply store and they stated need more information before giving it to him. Informed patient will call the supply store and see what info they need.

## 2019-05-23 NOTE — Telephone Encounter (Signed)
Talked to [employer] [place] [employer] number about the patient assistance. Velva Harman said they have patient's signature on file so they don't need him to come in and sign. The form will be signed by MD in am and will fax to number on form.  Patient called and told that the company (number above) still needs to talk to him directly and he is to call them. Number given and patient verbalizes understanding.   Velva Harman stated they had that patient didn't have insurance and if he does now to document. Verified with patient he now does have Medicaid and will put info on form for pt assistance.

## 2019-05-23 NOTE — Telephone Encounter (Signed)
Patient called to check on the status of this. He'd like a call back.

## 2019-05-23 NOTE — Telephone Encounter (Signed)
Call placed to [place] [phone]. Other number [phone].  MD ordered to restart Aubagio 14mg  daily.  Patient stated the MS society helps him pay for his medication. Checking with MS society to see if there is anything else that needs to be completed.   Speaking with rep at this time Manuela Schwartz) and she stated that the [place] doesn't help to pay and may be through [employer]  . Patient assistance program with Marya Fossa is called 1-1 and the number is  [phone] . Have the form am currently filling out.  Will call patient and make him aware of progress of this form and will have MD sign tomorrow.  Update - called and left message on patient's machine that he will need to sign these form and asked if he wants it mailed to his address on file and mail it back or to come in and sign it.

## 2019-05-24 NOTE — Telephone Encounter (Signed)
Please see this

## 2019-05-24 NOTE — Telephone Encounter (Signed)
Faxed one to one start form for patient assistance with aubagio to [phone].

## 2019-05-25 NOTE — Telephone Encounter (Signed)
Called and spoke to [place] at [employer] in regards to patient being able to obtain a wheelchair (specifically transport chair). A script was written and sent to patient and when he went to fill it at [facility] he was told more info was needed.  Ivin Booty stated there has to be a face to face MD visit where many specific qualifications need to be documented regarding ambulation/mobility issues. She is faxing info for future reference with medicare guidelines (would be the same with most insurances) of what needs to be documented in a face to face MD appt for the patient to be able to obtain certain DME (wc or bed).  Patient has already had MD appt and Ivin Booty stated there may not be a letter written or addendum to progress note rather will have to be at his next appt which is in March.   Called patient and let him know the above information and if he still desires/needs a w/c at his March appt we will have the needed information to be documented from that visit.  Patient verbalized understanding and said he would try other options - possibly borrowing one or calling MS society to see if may be able to obtain transport chair by another means.   Dr. Cristy Friedlander above for March visit .

## 2019-05-27 ENCOUNTER — Encounter: Payer: Self-pay | Admitting: *Deleted

## 2019-05-27 NOTE — Progress Notes (Addendum)
Called [facility] [phone] spoke with Shanon Brow who routed me to the pharmacy call ref ID [id number] spoke with Valetta Fuller she said the form under pharmacy at [email or web address] to use is " Standard Drug Request Form" if you want to fax the PA We completed the PA over the phone and it was approved  Valid 05/27/2019-05/21/2020 PA# [id number] I called [facility] [phone] to initiate the delivery of his meds and they said he is good to go they will reach out to him to set up delivery. They ran a claim and it went through.  I called Tomma Lightning coordinator of [employer] to [phone] ext [phone] [facility] with all the above  Info. She had initiated all this nd requested a call back.

## 2019-05-31 ENCOUNTER — Telehealth: Payer: Self-pay | Admitting: Neurology

## 2019-05-31 NOTE — Telephone Encounter (Signed)
Left message on patient's phone that our office submitted needed paperwork and [place] will be contacting patient for delivery of medication. Their number is [phone]. Number also left on patient's machine.  See 9/18 documentation regarding PA for more details.

## 2019-05-31 NOTE — Telephone Encounter (Signed)
Patient left vm about wanting to know if paperwork was sent over to the [place] so that he could get his medication. Thanks!

## 2019-06-08 ENCOUNTER — Other Ambulatory Visit: Payer: Self-pay

## 2019-06-08 DIAGNOSIS — Z20822 Contact with and (suspected) exposure to covid-19: Secondary | ICD-10-CM

## 2019-06-09 LAB — NOVEL CORONAVIRUS, NAA: SARS-CoV-2, NAA: NOT DETECTED

## 2019-06-18 ENCOUNTER — Ambulatory Visit: Admission: RE | Admit: 2019-06-18 | Payer: Medicaid Other | Source: Ambulatory Visit

## 2019-06-18 ENCOUNTER — Other Ambulatory Visit: Payer: Medicaid Other

## 2019-06-18 ENCOUNTER — Other Ambulatory Visit: Payer: Self-pay | Admitting: Neurology

## 2019-06-18 ENCOUNTER — Inpatient Hospital Stay: Admission: RE | Admit: 2019-06-18 | Payer: Medicaid Other | Source: Ambulatory Visit

## 2019-06-18 DIAGNOSIS — N319 Neuromuscular dysfunction of bladder, unspecified: Secondary | ICD-10-CM

## 2019-06-18 DIAGNOSIS — F32A Depression, unspecified: Secondary | ICD-10-CM

## 2019-06-18 DIAGNOSIS — F329 Major depressive disorder, single episode, unspecified: Secondary | ICD-10-CM

## 2019-06-18 DIAGNOSIS — M542 Cervicalgia: Secondary | ICD-10-CM

## 2019-06-18 DIAGNOSIS — G8929 Other chronic pain: Secondary | ICD-10-CM

## 2019-06-18 DIAGNOSIS — G35 Multiple sclerosis: Secondary | ICD-10-CM

## 2019-06-20 ENCOUNTER — Other Ambulatory Visit: Payer: Self-pay | Admitting: Neurology

## 2019-06-20 DIAGNOSIS — F32A Depression, unspecified: Secondary | ICD-10-CM

## 2019-06-20 DIAGNOSIS — F329 Major depressive disorder, single episode, unspecified: Secondary | ICD-10-CM

## 2019-06-20 DIAGNOSIS — G8929 Other chronic pain: Secondary | ICD-10-CM

## 2019-06-20 DIAGNOSIS — G35 Multiple sclerosis: Secondary | ICD-10-CM

## 2019-06-20 DIAGNOSIS — N319 Neuromuscular dysfunction of bladder, unspecified: Secondary | ICD-10-CM

## 2019-07-01 ENCOUNTER — Other Ambulatory Visit: Payer: Self-pay

## 2019-07-01 DIAGNOSIS — Z20822 Contact with and (suspected) exposure to covid-19: Secondary | ICD-10-CM

## 2019-07-03 LAB — NOVEL CORONAVIRUS, NAA: SARS-CoV-2, NAA: NOT DETECTED

## 2019-07-06 ENCOUNTER — Other Ambulatory Visit: Payer: Medicaid Other

## 2019-07-06 ENCOUNTER — Inpatient Hospital Stay: Admission: RE | Admit: 2019-07-06 | Payer: Medicaid Other | Source: Ambulatory Visit

## 2019-07-06 ENCOUNTER — Other Ambulatory Visit: Payer: Self-pay | Admitting: Neurology

## 2019-07-06 DIAGNOSIS — G8929 Other chronic pain: Secondary | ICD-10-CM

## 2019-07-06 DIAGNOSIS — F329 Major depressive disorder, single episode, unspecified: Secondary | ICD-10-CM

## 2019-07-06 DIAGNOSIS — G35 Multiple sclerosis: Secondary | ICD-10-CM

## 2019-07-06 DIAGNOSIS — F32A Depression, unspecified: Secondary | ICD-10-CM

## 2019-07-06 DIAGNOSIS — N319 Neuromuscular dysfunction of bladder, unspecified: Secondary | ICD-10-CM

## 2019-07-16 ENCOUNTER — Other Ambulatory Visit: Payer: Medicaid Other

## 2019-07-25 ENCOUNTER — Ambulatory Visit
Admission: RE | Admit: 2019-07-25 | Discharge: 2019-07-25 | Disposition: A | Discharge status: Home / Self Care | Payer: Medicaid Other | Source: Ambulatory Visit | Attending: Neurology | Admitting: Neurology

## 2019-07-25 ENCOUNTER — Other Ambulatory Visit: Payer: Self-pay

## 2019-07-25 DIAGNOSIS — F32A Depression, unspecified: Secondary | ICD-10-CM

## 2019-07-25 DIAGNOSIS — N319 Neuromuscular dysfunction of bladder, unspecified: Secondary | ICD-10-CM

## 2019-07-25 DIAGNOSIS — F329 Major depressive disorder, single episode, unspecified: Secondary | ICD-10-CM

## 2019-07-25 DIAGNOSIS — G8929 Other chronic pain: Secondary | ICD-10-CM

## 2019-07-25 DIAGNOSIS — G35 Multiple sclerosis: Secondary | ICD-10-CM

## 2019-07-25 MED ORDER — GADOBENATE DIMEGLUMINE 529 MG/ML IV SOLN
20.0000 mL | Freq: Once | INTRAVENOUS | Status: AC | PRN
  Administered 2019-07-25: 20 mL via INTRAVENOUS

## 2019-07-27 ENCOUNTER — Telehealth: Payer: Self-pay

## 2019-07-27 NOTE — Telephone Encounter (Signed)
-----   Message from Pieter Partridge, DO sent at 07/27/2019  9:32 AM EST ----- Based on prior MRI reports, this MRI of cervical spine appears stable.  Please confirm that he is also scheduled for MRI of brain with and without contrast.  Also, has he started Aubagio?  He is supposed to have liver function testing every month for the first 6 months.

## 2019-07-27 NOTE — Telephone Encounter (Signed)
Called spoke with Juliann Pulse at [facility] she states that the scan was incomplete due to scanner shut down. Patient was ask to call back to r/s  But never did. Called patient to make him aware of this. He will call them back to schedule MRI BRAIN. PA is still good. Orders are still up to date.

## 2019-07-27 NOTE — Telephone Encounter (Signed)
Called spoke with patient he was made aware of results and understands. He states that he had both imaging done. He does remember them put the device on his head for the MRI brain. Not sure why results was never sent will call to find out.  Patient is taken his Aubagio. A reminder is place to call patient to remind him to have labs drawn 1 week prior to appt on 11/11/18

## 2019-07-27 NOTE — Telephone Encounter (Signed)
OK.  Only the imaging and report of the cervical spine is available.

## 2019-08-15 ENCOUNTER — Ambulatory Visit
Admission: RE | Admit: 2019-08-15 | Discharge: 2019-08-15 | Disposition: A | Discharge status: Home / Self Care | Payer: Medicaid Other | Source: Ambulatory Visit | Attending: Neurology | Admitting: Neurology

## 2019-08-15 ENCOUNTER — Other Ambulatory Visit: Payer: Self-pay

## 2019-08-15 DIAGNOSIS — N319 Neuromuscular dysfunction of bladder, unspecified: Secondary | ICD-10-CM

## 2019-08-15 DIAGNOSIS — G8929 Other chronic pain: Secondary | ICD-10-CM

## 2019-08-15 DIAGNOSIS — F32A Depression, unspecified: Secondary | ICD-10-CM

## 2019-08-15 DIAGNOSIS — F329 Major depressive disorder, single episode, unspecified: Secondary | ICD-10-CM

## 2019-08-15 DIAGNOSIS — G35 Multiple sclerosis: Secondary | ICD-10-CM

## 2019-08-15 MED ORDER — GADOBENATE DIMEGLUMINE 529 MG/ML IV SOLN
18.0000 mL | Freq: Once | INTRAVENOUS | Status: AC | PRN
  Administered 2019-08-15: 18 mL via INTRAVENOUS

## 2019-08-16 ENCOUNTER — Telehealth: Payer: Self-pay

## 2019-08-16 NOTE — Telephone Encounter (Signed)
-----   Message from Pieter Partridge, DO sent at 08/16/2019  7:34 AM EST ----- MRI of brain shows nonspecific spots on the brain, but not characteristic for MS.  I would like him to make a follow up appointment to discuss further.  VV would be okay.

## 2019-08-16 NOTE — Progress Notes (Signed)
Virtual Visit via Telephone Note The purpose of this virtual visit is to provide medical care while limiting exposure to the novel coronavirus.    Consent was obtained for phone visit:  Yes.   Answered questions that patient had about telehealth interaction:  Yes.   I discussed the limitations, risks, security and privacy concerns of performing an evaluation and management service by telephone. I also discussed with the patient that there may be a patient responsible charge related to this service. The patient expressed understanding and agreed to proceed.  Pt location: Home Physician Location: office Name of referring provider:  Mardi Camacho,* I connected with .Tanner Camacho at patients initiation/request on 08/19/2019 at  8:50 AM EST by telephone and verified that I am speaking with the correct person using two identifiers.  Pt MRN:  [id number] Pt DOB:  10-Feb-1961   History of Present Illness:  Tanner Camacho is a 58 year old right-handed African American male who follows up for multiple sclerosis.  UPDATE: MRI of cervical spine with and without contrast from 07/25/2019 personally reviewed showed nonenhancing bilateral central cord signal abnormality at the level of C5-6, appearance more suggestive of myelomalacia rather than demyelinating plaque.  MRI of brain with and without contrast from 08/15/2019 was personally reviewed and showed nonspecific few scattered T2 and FLAIR foci within the cerebral hemispheric white matter, non-enhancing.  HISTORY: In 2014, he developed full body pain with numbness and tingling in the lower back radiating down the legs.  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  He did not undergo lumbar puncture.  No history of optic neuritis  He has never had an acute MS flare requiring IV steroids.  Vision:  Occasional blurriness for a second Motor:   Sometimes his legs give out while walking Sensory:  Numbness and tingling in feet and hands. Pain:  Whole body pain Gait:  Cannot walk long due to pain and legs sometimes give out.  Uses a cane. Bowel/Bladder:  Sometimes difficulty voiding urine.  Constipation. Fatigue:  All of the time.  Insomnia. Cognition:  Sometimes has word-finding difficulty.  Loses train of thought. Mood:  Depressed.  Current DMT:  Aubagio (started 06/2018 until 02/2019 because he ran out of it) Prior DMT:  Rebif, Avonex (01/2015-09/2015; discontinued after release from correctional facility. Restarted 04/2017 to 10/2017 stopped due to depression); Tecfidera (11/2017 to 01/2018; discontinued due to headaches, flushing, stomach pain, gas).    Other medications:  Diclofenac 50mg ; trazodone 100mg ; gabapentin 300mg  TID.  Past medications:  Flexeril.  MRIs: 11/04/2018 MRI of brain with and without contrast: Slight interval progression of white matter lesions compared to prior MRI from 10/09/2017, such as slightly more confluent left periatrial white matter signal.  No enhancing lesions. 10/09/2017 MRI of cervical spine with and without contrast; previously identified white matter lesions in upper cervical cord at C2 and C4 from prior MRI on 04/08/2017 are not identified on current study.  Unchanged bilateral lesions at C5-6.  Stable increased signal in the right lateral cord at T2.  No new or enhancing lesions. 10/09/2017 MRI of thoracic spine with and without contrast: Patchy increased T2 signal at multiple levels in the thoracic spinal cord with focal lesions at T1-2, T3, T7 and T8.  Focus of apparent enhancement in the cord favored to be artifactual. 04/08/2017 MRI of brain with and without contrast.  Overall stable number of multiple white  matter foci compared to prior study from 12/14/2014.  No new or enhancing lesions. 04/08/2017 MRI of cervical spine with and without contrast: New T2 nonenhancing lesions at C2 and C4 when compared to  prior study from 12/14/2014.  Unchanged T2 hyperintense lesion in the central cord at C5-C6 level.  Family history:  Cousin had MS and CVA.  LABS: 04/14/17: ACE 38, ANA 1: 80, RF negative, RPR negative, Lyme negative, TSH 3.351 10/09/2014.  RF negative, B12 293, ANCA negative  Past Medical History: Past Medical History:  Diagnosis Date  . Alcohol abuse   . Multiple sclerosis (HCC)   . Substance abuse (HCC)     Medications: Outpatient Encounter Medications as of 08/19/2019  Medication Sig  . AMBULATORY NON FORMULARY MEDICATION Transfer chair  . benzonatate (TESSALON) 100 MG capsule Take 1 capsule (100 mg total) by mouth 3 (three) times daily as needed for cough.  . diclofenac (VOLTAREN) 50 MG EC tablet Take 50 mg by mouth 3 (three) times daily.  Marland Kitchen gabapentin (NEURONTIN) 300 MG capsule Take 2 capsules (600 mg total) by mouth 3 (three) times daily.  . hydrOXYzine (ATARAX/VISTARIL) 25 MG tablet Take 25 mg by mouth 3 (three) times daily as needed.  . tamsulosin (FLOMAX) 0.4 MG CAPS capsule TAKE 1 CAPSULE BY MOUTH ONCE DAILY 30 MINUTES AFTER THE SAME MEAL EACH DAY  . Teriflunomide (AUBAGIO) 14 MG TABS Take by mouth. Unsure if he takes once or twice a day  . traZODone (DESYREL) 100 MG tablet Take 100 mg by mouth at bedtime.   No facility-administered encounter medications on file as of 08/19/2019.     Allergies: No Known Allergies  Family History: Family History  Problem Relation Age of Onset  . Dementia Mother   . Multiple sclerosis Cousin     Social History: Social History   Socioeconomic History  . Marital status: Single    Spouse name: Not on file  . Number of children: Not on file  . Years of education: Not on file  . Highest education level: Not on file  Occupational History  . Not on file  Social Needs  . Financial resource strain: Not on file  . Food insecurity    Worry: Not on file    Inability: Not on file  . Transportation needs    Medical: Not on file     Non-medical: Not on file  Tobacco Use  . Smoking status: Current Every Day Smoker    Packs/day: 0.50    Types: Cigarettes  . Smokeless tobacco: Never Used  Substance and Sexual Activity  . Alcohol use: Not Currently    Frequency: Never  . Drug use: Not Currently    Types: Cocaine  . Sexual activity: Not on file  Lifestyle  . Physical activity    Days per week: Not on file    Minutes per session: Not on file  . Stress: Not on file  Relationships  . Social Musician on phone: Not on file    Gets together: Not on file    Attends religious service: Not on file    Active member of club or organization: Not on file    Attends meetings of clubs or organizations: Not on file    Relationship status: Not on file  . Intimate partner violence    Fear of current or ex partner: Not on file    Emotionally abused: Not on file    Physically abused: Not on file  Forced sexual activity: Not on file  Other Topics Concern  . Not on file  Social History Narrative   Lives in shelter   Coffee - 1 cup in am    Observations/Objective:   No acute distress.  Alert and oriented.  Speech fluent and not dysarthric.  Language intact.    Assessment and Plan:   History of multiple sclerosis.  While MRI of brain showed white matter changes, it is nonspecific and conceivably be chronic small vessel disease.  MRI of cervical spine shows area of cord signal changes which is thought to represent area of myelomalacia, possibly related to his fall in 2018, rather than a demyelinating plaque.  He has never had a lumbar puncture.  Since a prior thoracic MRI reportedly showed lesions, we will check that.  1.  MRI of thoracic spine with and without contrast 2.  If unremarkable, then I would proceed with lumbar puncture.  Follow Up Instructions:    -I discussed the assessment and treatment plan with the patient. The patient was provided an opportunity to ask questions and all were answered. The  patient agreed with the plan and demonstrated an understanding of the instructions.   The patient was advised to call back or seek an in-person evaluation if the symptoms worsen or if the condition fails to improve as anticipated.    Total Time spent in visit with the patient was:  12 minutes.  Cira Servant, DO

## 2019-08-16 NOTE — Telephone Encounter (Signed)
Called spoke with patient he was informed of results and understands.  He was sent to front desk to schedule VV appt to discuss further.

## 2019-08-19 ENCOUNTER — Telehealth (INDEPENDENT_AMBULATORY_CARE_PROVIDER_SITE_OTHER): Payer: Medicaid Other | Admitting: Neurology

## 2019-08-19 ENCOUNTER — Other Ambulatory Visit: Payer: Self-pay

## 2019-08-19 DIAGNOSIS — G35 Multiple sclerosis: Secondary | ICD-10-CM

## 2019-08-19 NOTE — Addendum Note (Signed)
Addended by: Amado Coe on: 08/19/2019 10:44 AM   Modules accepted: Orders

## 2019-08-19 NOTE — Patient Instructions (Signed)
1.  We will order a spinal tap, checking CSF for cell count with diff, protein, glucose, gram stain and culture, cytology, oligoclonal bands, IgG index.

## 2019-09-19 ENCOUNTER — Ambulatory Visit
Admission: RE | Admit: 2019-09-19 | Discharge: 2019-09-19 | Disposition: A | Discharge status: Home / Self Care | Payer: Medicaid Other | Source: Ambulatory Visit | Attending: Neurology | Admitting: Neurology

## 2019-09-19 DIAGNOSIS — G35 Multiple sclerosis: Secondary | ICD-10-CM

## 2019-09-19 MED ORDER — GADOBENATE DIMEGLUMINE 529 MG/ML IV SOLN
18.0000 mL | Freq: Once | INTRAVENOUS | Status: AC | PRN
  Administered 2019-09-19: 18 mL via INTRAVENOUS

## 2019-09-20 ENCOUNTER — Other Ambulatory Visit: Payer: Self-pay

## 2019-09-20 DIAGNOSIS — G35 Multiple sclerosis: Secondary | ICD-10-CM

## 2019-09-27 ENCOUNTER — Other Ambulatory Visit: Payer: Self-pay | Admitting: Neurology

## 2019-09-27 ENCOUNTER — Ambulatory Visit
Admission: RE | Admit: 2019-09-27 | Discharge: 2019-09-27 | Disposition: A | Discharge status: Home / Self Care | Payer: Medicaid Other | Source: Ambulatory Visit | Attending: Neurology | Admitting: Neurology

## 2019-09-27 ENCOUNTER — Other Ambulatory Visit (HOSPITAL_COMMUNITY)
Admission: RE | Admit: 2019-09-27 | Discharge: 2019-09-27 | Disposition: A | Discharge status: Home / Self Care | Payer: Medicaid Other | Source: Ambulatory Visit | Attending: Neurology | Admitting: Neurology

## 2019-09-27 VITALS — BP 157/83 | HR 59

## 2019-09-27 DIAGNOSIS — G35 Multiple sclerosis: Secondary | ICD-10-CM | POA: Diagnosis not present

## 2019-09-27 NOTE — Progress Notes (Signed)
One SST tube of blood drawn from left [facility] space without difficulty for LP labs; site unremarkable.

## 2019-09-27 NOTE — Discharge Instructions (Signed)

## 2019-09-28 LAB — CYTOLOGY - NON PAP

## 2019-10-04 LAB — CSF CELL COUNT WITH DIFFERENTIAL
RBC Count, CSF: 1010 cells/uL — ABNORMAL HIGH
WBC, CSF: 3 cells/uL (ref 0–5)

## 2019-10-04 LAB — CNS IGG SYNTHESIS RATE, CSF+BLOOD
Albumin Serum: 4.7 g/dL (ref 3.5–5.2)
Albumin, CSF: 24.5 mg/dL (ref 8.0–42.0)
CNS-IgG Synthesis Rate: -3.6 mg/24 h (ref <=3.3)
IgG (Immunoglobin G), Serum: 1340 mg/dL (ref 600–1640)
IgG Total CSF: 3.4 mg/dL (ref 0.8–7.7)
IgG-Index: 0.49 (ref <0.66)

## 2019-10-04 LAB — CSF CULTURE W GRAM STAIN
GRAM STAIN:: NONE SEEN
MICRO NUMBER:: 10056390
Result:: NO GROWTH
SPECIMEN QUALITY:: ADEQUATE

## 2019-10-04 LAB — OLIGOCLONAL BANDS, CSF + SERM

## 2019-10-04 LAB — GLUCOSE, CSF: Glucose, CSF: 64 mg/dL (ref 40–80)

## 2019-10-04 LAB — PROTEIN, CSF: Total Protein, CSF: 47 mg/dL — ABNORMAL HIGH (ref 15–45)

## 2019-10-21 ENCOUNTER — Other Ambulatory Visit: Payer: Self-pay | Admitting: *Deleted

## 2019-10-21 DIAGNOSIS — Z20822 Contact with and (suspected) exposure to covid-19: Secondary | ICD-10-CM

## 2019-10-22 LAB — NOVEL CORONAVIRUS, NAA: SARS-CoV-2, NAA: NOT DETECTED

## 2019-11-10 NOTE — Progress Notes (Deleted)
NEUROLOGY FOLLOW UP OFFICE NOTE  [place] Benkert [id number]  HISTORY OF PRESENT ILLNESS: Tanner Dearden is a 5 year oldright-handedAfrican American male who follows up for multiple sclerosis.  UPDATE: Underwent LP on 09/27/2019.  CSF analysis showed cell count 3, borderline elevated protein 47, glucose 64,no oligoclonal bands,  IgG index 0.49, negative cytology, and gram stain & culture negative  ***  HISTORY: In 2014, he developed full body pain with numbness and tingling in the lower back radiating down the legs. 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 He did not undergo lumbar puncture. No history of optic neuritis  He has never had an acute MS flare requiring IV steroids.  Vision:Occasional blurriness for a second Motor: Sometimes his legs give out while walking Sensory: Numbness and tingling in feet and hands. Pain: Whole body pain Gait: Cannot walk long due to pain and legs sometimes give out. Uses a cane. Bowel/Bladder: Sometimes difficulty voiding urine. Constipation. Fatigue: All of the time. Insomnia. Cognition: Sometimes has word-finding difficulty. Loses train of thought. Mood: Depressed.  Current DMT: Aubagio (started 06/2018 until 02/2019 because he ran out of it) Prior WUJ:WJXBJ,YNWGNF (01/2015-09/2015; discontinued after release from correctional facility. Restarted 04/2017 to 10/2017 stopped due to depression); Tecfidera (11/2017 to 01/2018; discontinued due to headaches, flushing, stomach pain, gas).   Other medications:Diclofenac 50mg ; trazodone 100mg ; gabapentin 300mg  TID.  Past medications: Flexeril.  IMAGING: 08/15/2019 MRI of brain with and without contrast: nonspecific few scattered T2 and FLAIR foci within the cerebral hemispheric white matter, non-enhancing. 07/25/2019 MRI of cervical spine with and without contrast:  nonenhancing  bilateral central cord signal abnormality at the level of C5-6, appearance more suggestive of myelomalacia rather than demyelinating plaque.   2/27/2020MRI of brain with and without contrast: Slight interval progression of white matter lesions compared to prior MRI from 10/09/2017, such as slightly more confluent left periatrial white matter signal. No enhancing lesions. 10/09/2017 MRI of cervical spine with and without contrast; previously identified white matter lesions in upper cervical cord at C2 and C4 from prior MRI on 04/08/2017 are not identified on current study. Unchanged bilateral lesions at C5-6. Stable increased signal in the right lateral cord at T2. No new or enhancing lesions. 10/09/2017 MRI of thoracic spine with and without contrast: Patchy increased T2 signal at multiple levels in the thoracic spinal cord with focal lesions at T1-2, T3, T7 and T8. Focus of apparent enhancement in the cord favored to be artifactual. 04/08/2017 MRI of brain with and without contrast. Overall stable number of multiple white matter foci compared to prior study from 12/14/2014. No new or enhancing lesions. 04/08/2017 MRI of cervical spine with and without contrast: New T2 nonenhancing lesions at C2 and C4 when compared to prior study from 12/14/2014. Unchanged T2 hyperintense lesion in the central cord at C5-C6 level.  Family history: Cousin had MS and CVA.  LABS: 04/14/17:ACE 38, ANA 1: 80, RF negative, RPR negative, Lyme negative, TSH 3.351 10/09/2014. RF negative, B12 293, ANCA negative  PAST MEDICAL HISTORY: Past Medical History:  Diagnosis Date  . Alcohol abuse   . Multiple sclerosis (HCC)   . Substance abuse [facility])     MEDICATIONS: Current Outpatient Medications on File Prior to Visit  Medication Sig Dispense Refill  . AMBULATORY NON FORMULARY MEDICATION Transfer chair 1 Units 0  . diclofenac (VOLTAREN) 50 MG EC tablet Take 50 mg by mouth 3 (three) times  daily.    . gabapentin (NEURONTIN) 300  MG capsule Take 2 capsules (600 mg total) by mouth 3 (three) times daily. 180 capsule 5  . hydrOXYzine (ATARAX/VISTARIL) 25 MG tablet Take 25 mg by mouth 3 (three) times daily as needed.    . tamsulosin (FLOMAX) 0.4 MG CAPS capsule TAKE 1 CAPSULE BY MOUTH ONCE DAILY 30 MINUTES AFTER THE SAME MEAL EACH DAY    . Teriflunomide (AUBAGIO) 14 MG TABS Take by mouth. Unsure if he takes once or twice a day    . traZODone (DESYREL) 100 MG tablet Take 100 mg by mouth at bedtime.     No current facility-administered medications on file prior to visit.    ALLERGIES: No Known Allergies  FAMILY HISTORY: Family History  Problem Relation Age of Onset  . Dementia Mother   . Multiple sclerosis Cousin    ***.  SOCIAL HISTORY: Social History   Socioeconomic History  . Marital status: Single    Spouse name: Not on file  . Number of children: Not on file  . Years of education: Not on file  . Highest education level: Not on file  Occupational History  . Not on file  Tobacco Use  . Smoking status: Current Every Day Smoker    Packs/day: 0.50    Types: Cigarettes  . Smokeless tobacco: Never Used  Substance and Sexual Activity  . Alcohol use: Not Currently  . Drug use: Not Currently    Types: Cocaine  . Sexual activity: Not on file  Other Topics Concern  . Not on file  Social History Narrative   Lives in shelter   Coffee - 1 cup in am   Social Determinants of Health   Financial Resource Strain:   . Difficulty of Paying Living Expenses: Not on file  Food Insecurity:   . Worried About Charity fundraiser in the Last Year: Not on file  . Ran Out of Food in the Last Year: Not on file  Transportation Needs:   . Lack of Transportation (Medical): Not on file  . Lack of Transportation (Non-Medical): Not on file  Physical Activity:   . Days of Exercise per Week: Not on file  . Minutes of Exercise per Session: Not on file  Stress:   . Feeling of Stress : Not on file  Social Connections:   .  Frequency of Communication with Friends and Family: Not on file  . Frequency of Social Gatherings with Friends and Family: Not on file  . Attends Religious Services: Not on file  . Active Member of Clubs or Organizations: Not on file  . Attends Archivist Meetings: Not on file  . Marital Status: Not on file  Intimate Partner Violence:   . Fear of Current or Ex-Partner: Not on file  . Emotionally Abused: Not on file  . Physically Abused: Not on file  . Sexually Abused: Not on file    REVIEW OF SYSTEMS: Constitutional: No fevers, chills, or sweats, no generalized fatigue, change in appetite Eyes: No visual changes, double vision, eye pain Ear, nose and throat: No hearing loss, ear pain, nasal congestion, sore throat Cardiovascular: No chest pain, palpitations Respiratory:  No shortness of breath at rest or with exertion, wheezes GastrointestinaI: No nausea, vomiting, diarrhea, abdominal pain, fecal incontinence Genitourinary:  No dysuria, urinary retention or frequency Musculoskeletal:  No neck pain, back pain Integumentary: No rash, pruritus, skin lesions Neurological: as above Psychiatric: No depression, insomnia, anxiety Endocrine: No palpitations, fatigue, diaphoresis,  mood swings, change in appetite, change in weight, increased thirst Hematologic/Lymphatic:  No purpura, petechiae. Allergic/Immunologic: no itchy/runny eyes, nasal congestion, recent allergic reactions, rashes  PHYSICAL EXAM: *** General: No acute distress.  Patient appears ***-groomed.   Head:  Normocephalic/atraumatic Eyes:  Fundi examined but not visualized Neck: supple, no paraspinal tenderness, full range of motion Heart:  Regular rate and rhythm Lungs:  Clear to auscultation bilaterally Back: No paraspinal tenderness Neurological Exam: alert and oriented to person, place, and time. Attention span and concentration intact, recent and remote memory intact, fund of knowledge intact.  Speech fluent  and not dysarthric, language intact.  CN II-XII intact. Bulk and tone normal, muscle strength 5/5 throughout.  Sensation to light touch, temperature and vibration intact.  Deep tendon reflexes 2+ throughout, toes downgoing.  Finger to nose and heel to shin testing intact.  Gait normal, Romberg negative.  IMPRESSION: ***  PLAN: ***  Shon Millet, DO  CC: ***

## 2019-11-11 ENCOUNTER — Ambulatory Visit: Payer: Medicaid Other | Admitting: Neurology

## 2019-11-15 ENCOUNTER — Ambulatory Visit (INDEPENDENT_AMBULATORY_CARE_PROVIDER_SITE_OTHER): Payer: Medicaid Other | Admitting: Neurology

## 2019-11-15 ENCOUNTER — Other Ambulatory Visit: Payer: Self-pay

## 2019-11-15 ENCOUNTER — Encounter: Payer: Self-pay | Admitting: Neurology

## 2019-11-15 VITALS — BP 168/92 | HR 66 | Resp 18 | Ht 72.0 in | Wt 192.0 lb

## 2019-11-15 DIAGNOSIS — M79601 Pain in right arm: Secondary | ICD-10-CM | POA: Diagnosis not present

## 2019-11-15 DIAGNOSIS — I10 Essential (primary) hypertension: Secondary | ICD-10-CM | POA: Diagnosis not present

## 2019-11-15 DIAGNOSIS — M79604 Pain in right leg: Secondary | ICD-10-CM

## 2019-11-15 DIAGNOSIS — G959 Disease of spinal cord, unspecified: Secondary | ICD-10-CM

## 2019-11-15 NOTE — Progress Notes (Signed)
NEUROLOGY FOLLOW UP OFFICE NOTE  Tanner Camacho [id number]  HISTORY OF PRESENT ILLNESS: Tanner Camacho is a 59 year oldright-handedAfrican American male who follows up for multiple sclerosis.  UPDATE: Underwent LP on 09/27/2019.  CSF analysis showed cell count 3, borderline elevated protein 47, glucose 64,no oligoclonal bands,  IgG index 0.49, negative cytology, and gram stain & culture negative  He says he feels that his right foot has been getting weaker.  He continues to have pain down the right leg and pain in the right arm.  HISTORY: In 2014, he developed full body pain with numbness and tingling in the lower back radiating down the legs. 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 He did not undergo lumbar puncture. No history of optic neuritis  He has never had an acute MS flare requiring IV steroids.  Vision:Occasional blurriness for a second Motor: Sometimes his legs give out while walking Sensory: Numbness and tingling in feet and hands. Pain: Whole body pain Gait: Cannot walk long due to pain and legs sometimes give out. Uses a cane. Bowel/Bladder: Sometimes difficulty voiding urine. Constipation. Fatigue: All of the time. Insomnia. Cognition: Sometimes has word-finding difficulty. Loses train of thought. Mood: Depressed.  Current DMT: Aubagio (started 06/2018 until 02/2019 because he ran out of it) Prior VFI:EPPIR,JJOACZ (01/2015-09/2015; discontinued after release from correctional facility. Restarted 04/2017 to 10/2017 stopped due to depression); Tecfidera (11/2017 to 01/2018; discontinued due to headaches, flushing, stomach pain, gas).   Other medications:Diclofenac 50mg ; trazodone 100mg ; gabapentin 300mg  TID.  Past medications: Flexeril.  IMAGING: 08/15/2019 MRI of brain with and without contrast: nonspecific few scattered T2 and FLAIR foci  within the cerebral hemispheric white matter, non-enhancing. 07/25/2019 MRI of cervical spine with and without contrast:  nonenhancing bilateral central cord signal abnormality at the level of C5-6, appearance more suggestive of myelomalacia rather than demyelinating plaque.   2/27/2020MRI of brain with and without contrast: Slight interval progression of white matter lesions compared to prior MRI from 10/09/2017, such as slightly more confluent left periatrial white matter signal. No enhancing lesions. 10/09/2017 MRI of cervical spine with and without contrast; previously identified white matter lesions in upper cervical cord at C2 and C4 from prior MRI on 04/08/2017 are not identified on current study. Unchanged bilateral lesions at C5-6. Stable increased signal in the right lateral cord at T2. No new or enhancing lesions. 10/09/2017 MRI of thoracic spine with and without contrast: Patchy increased T2 signal at multiple levels in the thoracic spinal cord with focal lesions at T1-2, T3, T7 and T8. Focus of apparent enhancement in the cord favored to be artifactual. 04/08/2017 MRI of brain with and without contrast. Overall stable number of multiple white matter foci compared to prior study from 12/14/2014. No new or enhancing lesions. 04/08/2017 MRI of cervical spine with and without contrast: New T2 nonenhancing lesions at C2 and C4 when compared to prior study from 12/14/2014. Unchanged T2 hyperintense lesion in the central cord at C5-C6 level.  Family history: Cousin had MS and CVA.  LABS: 04/14/17:ACE 38, ANA 1: 80, RF negative, RPR negative, Lyme negative, TSH 3.351 10/09/2014. RF negative, B12 293, ANCA negative  PAST MEDICAL HISTORY: Past Medical History:  Diagnosis Date  . Alcohol abuse   . Multiple sclerosis (HCC)   . Substance abuse [facility])     MEDICATIONS: Current Outpatient Medications on File Prior to Visit  Medication Sig Dispense Refill  .  AMBULATORY NON FORMULARY MEDICATION Transfer  chair 1 Units 0  . diclofenac (VOLTAREN) 50 MG EC tablet Take 50 mg by mouth 3 (three) times daily.    Marland Kitchen gabapentin (NEURONTIN) 300 MG capsule Take 2 capsules (600 mg total) by mouth 3 (three) times daily. 180 capsule 5  . hydrOXYzine (ATARAX/VISTARIL) 25 MG tablet Take 25 mg by mouth 3 (three) times daily as needed.    . tamsulosin (FLOMAX) 0.4 MG CAPS capsule TAKE 1 CAPSULE BY MOUTH ONCE DAILY 30 MINUTES AFTER THE SAME MEAL EACH DAY    . Teriflunomide (AUBAGIO) 14 MG TABS Take by mouth. Unsure if he takes once or twice a day    . traZODone (DESYREL) 100 MG tablet Take 100 mg by mouth at bedtime.     No current facility-administered medications on file prior to visit.    ALLERGIES: No Known Allergies  FAMILY HISTORY: Family History  Problem Relation Age of Onset  . Dementia Mother   . Multiple sclerosis Cousin     SOCIAL HISTORY: Social History   Socioeconomic History  . Marital status: Single    Spouse name: Not on file  . Number of children: Not on file  . Years of education: Not on file  . Highest education level: Not on file  Occupational History  . Not on file  Tobacco Use  . Smoking status: Current Every Day Smoker    Packs/day: 0.50    Types: Cigarettes  . Smokeless tobacco: Never Used  Substance and Sexual Activity  . Alcohol use: Not Currently  . Drug use: Not Currently    Types: Cocaine  . Sexual activity: Not on file  Other Topics Concern  . Not on file  Social History Narrative   Lives in shelter   Coffee - 1 cup in am   Social Determinants of Health   Financial Resource Strain:   . Difficulty of Paying Living Expenses: Not on file  Food Insecurity:   . Worried About Charity fundraiser in the Last Year: Not on file  . Ran Out of Food in the Last Year: Not on file  Transportation Needs:   . Lack of Transportation (Medical): Not on file  . Lack of Transportation (Non-Medical): Not on file  Physical Activity:   . Days of Exercise per Week: Not on  file  . Minutes of Exercise per Session: Not on file  Stress:   . Feeling of Stress : Not on file  Social Connections:   . Frequency of Communication with Friends and Family: Not on file  . Frequency of Social Gatherings with Friends and Family: Not on file  . Attends Religious Services: Not on file  . Active Member of Clubs or Organizations: Not on file  . Attends Archivist Meetings: Not on file  . Marital Status: Not on file  Intimate Partner Violence:   . Fear of Current or Ex-Partner: Not on file  . Emotionally Abused: Not on file  . Physically Abused: Not on file  . Sexually Abused: Not on file    REVIEW OF SYSTEMS: Constitutional: No fevers, chills, or sweats, no generalized fatigue, change in appetite Eyes: No visual changes, double vision, eye pain Ear, nose and throat: No hearing loss, ear pain, nasal congestion, sore throat Cardiovascular: No chest pain, palpitations Respiratory:  No shortness of breath at rest or with exertion, wheezes GastrointestinaI: No nausea, vomiting, diarrhea, abdominal pain, fecal incontinence Genitourinary:  No dysuria, urinary retention or frequency Musculoskeletal:  No neck pain, back pain Integumentary: No rash, pruritus, skin lesions Neurological: as above Psychiatric: No depression, insomnia, anxiety Endocrine: No palpitations, fatigue, diaphoresis, mood swings, change in appetite, change in weight, increased thirst Hematologic/Lymphatic:  No purpura, petechiae. Allergic/Immunologic: no itchy/runny eyes, nasal congestion, recent allergic reactions, rashes  PHYSICAL EXAM: Blood pressure (!) 168/92, pulse 66, resp. rate 18, height 6' (1.829 m), weight 192 lb (87.1 kg), SpO2 98 %. General: No acute distress.  Patient appears well-groomed.   Head:  Normocephalic/atraumatic Eyes:  Fundi examined but not visualized Neck: supple, no paraspinal tenderness, full range of motion Heart:  Regular rate and rhythm Lungs:  Clear to  auscultation bilaterally Back: No paraspinal tenderness Neurological Exam: alert and oriented to person, place, and time. Attention span and concentration intact, recent and remote memory intact, fund of knowledge intact.  Speech fluent and not dysarthric, language intact.  CN II-XII intact. Bulk and tone normal, some very mild give-way weakness in right ankle dorsiflexion but overall muscle strength 5/5 throughout.  Sensation to light touch, temperature and vibration intact.  Deep tendon reflexes 2+ throughout, toes downgoing.  Finger to nose and heel to shin testing intact.  Right limp, Romberg negative.  IMPRESSION: 59 year old male with right sided pain and subjective right foot weakness, treated for multiple sclerosis since 2016.    I do not think he has multiple sclerosis.  MRI findings of cervical spine appear to be chronic post-traumatic changes, probably from one of his past falls.  White matter changes in brain are nonspecific and appear more likely to be chronic small vessel ischemic changes.  CSF analysis normal with no evidence of inflammation.  No clear structural evidence of radiculopathy on cervical spine.  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  Lower extremity symptoms may be due to lumbar radiculopathy  HTN  PLAN: 1.  NCV-EMG right upper and lower extremities 2.  Consider MRI of lumbar spine 3.  Follow up after testing 4.  Follow up with PCP regarding blood pressure  Shon Millet, DO  CC:  Valerie Roys, FNP

## 2019-11-15 NOTE — Patient Instructions (Addendum)
Nerve study of right arm and leg. Follow up after testing.

## 2019-12-13 ENCOUNTER — Encounter: Payer: Medicaid Other | Admitting: Neurology

## 2020-01-23 ENCOUNTER — Encounter: Payer: Self-pay | Admitting: Gastroenterology

## 2020-02-22 ENCOUNTER — Other Ambulatory Visit: Payer: Self-pay

## 2020-02-22 ENCOUNTER — Encounter (HOSPITAL_COMMUNITY): Payer: Self-pay | Admitting: *Deleted

## 2020-02-22 ENCOUNTER — Emergency Department (HOSPITAL_COMMUNITY): Payer: Medicaid Other

## 2020-02-22 ENCOUNTER — Emergency Department (HOSPITAL_COMMUNITY)
Admission: EM | Admit: 2020-02-22 | Discharge: 2020-02-22 | Disposition: A | Discharge status: Home / Self Care | Payer: Medicaid Other | Attending: Emergency Medicine | Admitting: Emergency Medicine

## 2020-02-22 DIAGNOSIS — G35 Multiple sclerosis: Secondary | ICD-10-CM | POA: Insufficient documentation

## 2020-02-22 DIAGNOSIS — M79641 Pain in right hand: Secondary | ICD-10-CM

## 2020-02-22 DIAGNOSIS — F17210 Nicotine dependence, cigarettes, uncomplicated: Secondary | ICD-10-CM | POA: Diagnosis not present

## 2020-02-22 DIAGNOSIS — Z79899 Other long term (current) drug therapy: Secondary | ICD-10-CM | POA: Diagnosis not present

## 2020-02-22 MED ORDER — CYCLOBENZAPRINE HCL 10 MG PO TABS: 10.0000 mg | ORAL_TABLET | Freq: Two times a day (BID) | ORAL | 0 refills | Status: DC | PRN

## 2020-02-22 MED ORDER — IBUPROFEN 600 MG PO TABS: 600.0000 mg | ORAL_TABLET | Freq: Four times a day (QID) | ORAL | 0 refills | Status: DC | PRN

## 2020-02-22 NOTE — Discharge Instructions (Addendum)
Please wear wrist brace for support take ibuprofen and Flexeril as needed for pain.  Continue to ice and compression as needed.  Follow-up with orthopedist for further care.

## 2020-02-22 NOTE — ED Provider Notes (Signed)
[name] Provider Note   CSN: [id number] Arrival date & time: 02/22/20  1357     History Chief Complaint  Patient presents with  . Hand Pain    Tanner Camacho is a 59 y.o. male.  The history is provided by the patient and medical records. No language interpreter was used.  Hand Pain     59 year old male with history of MS, polysubstance abuse, presenting complaining of right hand pain patient report for the past week he has noticed pain to his right hand.  Pain primarily to the dorsal right hand.  He noticed swelling to the affected area.  Pain is achy throbbing with tingling numbness sensation.  Pain is moderate in severity with some improvement with the accident.  He also tried gabapentin 3 times daily without relief.  He does not complain of any fever, having cold symptoms, having arm or hand weakness.  He denies any neck pain or shoulder pain.  He denies any recent injury.  He is right-hand dominant.  He denies doing repetitive work.   Past Medical History:  Diagnosis Date  . Alcohol abuse   . Multiple sclerosis ([place])   . Substance abuse [facility])     Patient Active Problem List   Diagnosis Date Noted  . 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 Multiple sclerosis ([place]) 04/14/2017    Past Surgical History:  Procedure Laterality Date  . BACK SURGERY     cyst removed from back        Family History  Problem Relation Age of Onset  . Dementia Mother   . Multiple sclerosis Cousin     Social History   Tobacco Use  . Smoking status: Current Every Day Smoker    Packs/day: 0.50    Types: Cigarettes  . Smokeless tobacco: Never Used  Vaping Use  . Vaping Use: Never assessed  Substance Use Topics  . Alcohol use: Not Currently  . Drug use: Not Currently    Types: Cocaine    Home Medications Prior to Admission medications   Medication Sig Start Date End Date Taking? Authorizing Provider  AMBULATORY NON FORMULARY MEDICATION  Transfer chair 05/17/19   Pieter Partridge, DO  diclofenac (VOLTAREN) 50 MG EC tablet Take 50 mg by mouth 3 (three) times daily. 04/04/19   [provider]  gabapentin (NEURONTIN) 300 MG capsule Take 2 capsules (600 mg total) by mouth 3 (three) times daily. 05/13/19   Pieter Partridge, DO  hydrOXYzine (ATARAX/VISTARIL) 25 MG tablet Take 25 mg by mouth 3 (three) times daily as needed.    [provider]  tamsulosin (FLOMAX) 0.4 MG CAPS capsule TAKE 1 CAPSULE BY MOUTH ONCE DAILY 30 MINUTES AFTER THE SAME MEAL EACH DAY 04/01/19   [provider]  Teriflunomide (AUBAGIO) 14 MG TABS Take by mouth. Unsure if he takes once or twice a day 02/10/19   [provider]  traZODone (DESYREL) 100 MG tablet Take 100 mg by mouth at bedtime.    [provider]    Allergies    Patient has no known allergies.  Review of Systems   Review of Systems  Constitutional: Negative for fever.  Musculoskeletal: Positive for arthralgias and myalgias.  Skin: Negative for rash and wound.  Neurological: Positive for numbness.    Physical Exam Updated Vital Signs BP (!) 141/77 (BP Location: Left Arm)   Pulse 60   Temp 98.4 F (36.9 C) (Oral)   Resp 18   SpO2 100%  Physical Exam Vitals and nursing note reviewed.  Constitutional:      General: He is not in acute distress.    Appearance: He is well-developed.  HENT:     Head: Atraumatic.  Eyes:     Conjunctiva/sclera: Conjunctivae normal.  Musculoskeletal:        General: Tenderness (Right hand: Mild tenderness to palpation of the right hand without any focal point tenderness.  No obvious overlying skin changes noted.  No swelling noted.  Decreased grip strength compared to left however brisk cap refill throughout with normal radial pu) present.     Cervical back: Neck supple.     Comments: Right arm: No tenderness to right shoulder or elbow.  Compartment is soft.  Skin:    Findings: No rash.  Neurological:     Mental Status:  He is alert.     ED Results / Procedures / Treatments   Labs (all labs ordered are listed, but only abnormal results are displayed) Labs Reviewed - No data to display  EKG None  Radiology DG Hand Complete Right  Result Date: 02/22/2020 CLINICAL DATA:  Right hand pain radiating about the digits, no injury, woke with pain EXAM: RIGHT HAND - COMPLETE 3+ VIEW COMPARISON:  None. FINDINGS: No acute bony abnormality. Specifically, no fracture, subluxation, or dislocation. Mild diffuse interphalangeal arthrosis including a corticated, fragmented osteophyte at the first interphalangeal joint. Mild degenerative change in spurring at the intercarpal articulations as well most pronounced at the triscaphe joint. Normal bone mineralization. No worrisome osseous lesions. Soft tissues are unremarkable without gas or focal nodularity. 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 No acute osseous or soft tissue abnormality. Electronically Signed   By: Kreg Shropshire M.D.   On: 02/22/2020 16:25    Procedures Procedures (including critical care time)  Medications Ordered in ED Medications - No data to display  ED Course  I have reviewed the triage vital signs and the nursing notes.  Pertinent labs & imaging results that were available during my care of the patient were reviewed by me and considered in my medical decision making (see chart for details).    MDM Rules/Calculators/A&P                          BP (!) 141/77 (BP Location: Left Arm)   Pulse 60   Temp 98.4 F (36.9 C) (Oral)   Resp 18   SpO2 100%   Final Clinical Impression(s) / ED Diagnoses Final diagnoses:  Right hand pain    Rx / DC Orders ED Discharge Orders         Ordered    ibuprofen (ADVIL) 600 MG tablet  Every 6 hours PRN     Discontinue  Reprint     02/22/20 1746    cyclobenzaprine (FLEXERIL) 10 MG tablet  2 times daily PRN     Discontinue   Reprint     02/22/20 1746         5:44 PM Patient complaining of pain swelling to dorsum of his right hand ongoing for the past week.  Examination reveals no evidence concerning for infectious etiology.  He has good radial pulse and brisk cap refill.  Sensation is intact throughout both arms.  No neck pain to suggest cervical radiculopathy.  States pain started in the hand radiates to the elbow.  Suspect some aspects of arthritis causing his discomfort.  I have  review prior notes with most recent note from his neurologist to palpation is likely does not have multiple sclerosis as he has had brain and cervical spine MRI done recently as well as lumbar puncture reveals no evidence of findings suggestive of MS.  Will provide wrist brace for support, rice therapy, orthopedic referral given.   Fayrene Helper, PA-C 02/22/20 1749    Mancel Bale, MD 02/22/20 [phone]

## 2020-02-22 NOTE — ED Notes (Signed)
Pt checked in and seen leaving the waiting room.

## 2020-02-22 NOTE — ED Triage Notes (Signed)
Pt is here with right hand pain and swelling for a while.  Pt has a history of MS

## 2020-03-01 ENCOUNTER — Ambulatory Visit (AMBULATORY_SURGERY_CENTER): Payer: Self-pay | Admitting: *Deleted

## 2020-03-01 ENCOUNTER — Other Ambulatory Visit: Payer: Self-pay

## 2020-03-01 VITALS — Ht 72.0 in | Wt 190.0 lb

## 2020-03-01 DIAGNOSIS — Z1211 Encounter for screening for malignant neoplasm of colon: Secondary | ICD-10-CM

## 2020-03-01 MED ORDER — SUPREP BOWEL PREP KIT 17.5-3.13-1.6 GM/177ML PO SOLN: 1.0000 | Freq: Once | ORAL | 0 refills | Status: AC

## 2020-03-01 NOTE — Progress Notes (Signed)
Pt verified name, DOB, address and insurance during PV today. Pt mailed instruction packet to included paper to complete and mail back to [facility] with addressed and stamped envelope, Emmi video, copy of consent form to read and not return, and instructions.  PV completed over the phone. Pt encouraged to call with questions or issues   J and J vaccine 12-2019   No egg or soy allergy known to patient  No issues with past sedation with any surgeries  or procedures, no intubation problems  No diet pills per patient No home 02 use per patient  No blood thinners per patient  Pt denies issues with constipation  No A fib or A flutter  EMMI video sent to pt's e mail  COVID 19 guidelines implemented in PV today   Due to the COVID-19 pandemic we are asking patients to follow these guidelines. Please only bring one care partner. Please be aware that your care partner may wait in the car in the parking lot or if they feel like they will be too hot to wait in the car, they may wait in the lobby on the 4th floor. All care partners are required to wear a mask the entire time (we do not have any that we can provide them), they need to practice social distancing, and we will do a Covid check for all patient's and care partners when you arrive. Also we will check their temperature and your temperature. If the care partner waits in their car they need to stay in the parking lot the entire time and we will call them on their cell phone when the patient is ready for discharge so they can bring the car to the front of the building. Also all patient's will need to wear a mask into building.

## 2020-03-16 ENCOUNTER — Encounter: Payer: Medicaid Other | Admitting: Gastroenterology

## 2020-08-29 ENCOUNTER — Other Ambulatory Visit: Payer: Self-pay | Admitting: Adult Medicine

## 2021-01-15 ENCOUNTER — Other Ambulatory Visit: Payer: Self-pay | Admitting: Neurology

## 2021-01-16 ENCOUNTER — Telehealth: Payer: Self-pay

## 2021-01-16 NOTE — Telephone Encounter (Signed)
Called and left a message to call back and schedule follow up with Dr. Everlena Cooper.

## 2021-01-16 NOTE — Telephone Encounter (Signed)
Pt needs to be seen before we can refill  Abuagio.

## 2021-01-16 NOTE — Telephone Encounter (Signed)
That is correct 

## 2021-01-17 NOTE — Telephone Encounter (Signed)
Patient scheduled for 07/25/21 at 10:10 AM with Dr. Everlena Cooper.   He is needing refills on his Abuagio to last until then.  [facility] on [address]

## 2021-01-18 NOTE — Telephone Encounter (Signed)
Called patient and added to wait list as a high priority for any openings, per patient request. No sooner appointment available at this time.  He will reach out of his primary care provider for refills in the meantime.

## 2021-01-18 NOTE — Telephone Encounter (Signed)
I'll reach out to him again today.   Please note: Upon scheduling yesterday, patient stated he can only come on a Thursday and did not request wait list.
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
## 2021-01-21 ENCOUNTER — Ambulatory Visit (INDEPENDENT_AMBULATORY_CARE_PROVIDER_SITE_OTHER): Payer: Medicaid Other | Admitting: Neurology

## 2021-01-21 ENCOUNTER — Other Ambulatory Visit: Payer: Self-pay

## 2021-01-21 ENCOUNTER — Encounter: Payer: Self-pay | Admitting: Neurology

## 2021-01-21 VITALS — BP 148/86 | HR 71 | Ht 72.0 in | Wt 182.4 lb

## 2021-01-21 DIAGNOSIS — G35 Multiple sclerosis: Secondary | ICD-10-CM

## 2021-01-23 ENCOUNTER — Other Ambulatory Visit: Payer: Self-pay | Admitting: Neurology

## 2021-02-15 ENCOUNTER — Emergency Department (HOSPITAL_COMMUNITY): Payer: Medicaid Other

## 2021-02-15 ENCOUNTER — Emergency Department (HOSPITAL_COMMUNITY)
Admission: EM | Admit: 2021-02-15 | Discharge: 2021-02-15 | Disposition: A | Discharge status: Home / Self Care | Payer: Medicaid Other | Attending: Emergency Medicine | Admitting: Emergency Medicine

## 2021-02-15 ENCOUNTER — Other Ambulatory Visit: Payer: Self-pay

## 2021-02-15 DIAGNOSIS — M542 Cervicalgia: Secondary | ICD-10-CM | POA: Insufficient documentation

## 2021-02-15 DIAGNOSIS — F17210 Nicotine dependence, cigarettes, uncomplicated: Secondary | ICD-10-CM | POA: Insufficient documentation

## 2021-02-15 DIAGNOSIS — M549 Dorsalgia, unspecified: Secondary | ICD-10-CM | POA: Diagnosis not present

## 2021-02-15 DIAGNOSIS — M25562 Pain in left knee: Secondary | ICD-10-CM | POA: Diagnosis present

## 2021-02-15 DIAGNOSIS — Y92410 Unspecified street and highway as the place of occurrence of the external cause: Secondary | ICD-10-CM | POA: Insufficient documentation

## 2021-02-15 MED ORDER — METHOCARBAMOL 500 MG PO TABS
500.0000 mg | ORAL_TABLET | Freq: Once | ORAL | Status: AC
  Administered 2021-02-15: 500 mg via ORAL
  Filled 2021-02-15: qty 1

## 2021-02-15 MED ORDER — ACETAMINOPHEN 500 MG PO TABS
1000.0000 mg | ORAL_TABLET | Freq: Once | ORAL | Status: AC
  Administered 2021-02-15: 1000 mg via ORAL
  Filled 2021-02-15: qty 2

## 2021-02-15 MED ORDER — METHOCARBAMOL 500 MG PO TABS: 500.0000 mg | ORAL_TABLET | Freq: Three times a day (TID) | ORAL | 0 refills | Status: DC | PRN
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
## 2021-02-15 NOTE — ED Notes (Signed)
Patient called for room, no answer x2.
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
## 2021-07-25 ENCOUNTER — Ambulatory Visit: Payer: Medicaid Other | Admitting: Neurology

## 2021-11-09 IMAGING — MR MR HEAD WO/W CM
13 series · 48 of 48 positions shown · IV contrast (multihance)
Comparison: Report from examination 05/05/2014. Actual images not
available.

CLINICAL DATA: Pain. Fatigue. Neurogenic bladder. Multiple
sclerosis.

EXAM:
MRI HEAD WITHOUT AND WITH CONTRAST
TECHNIQUE: Multiplanar, multiecho pulse sequences of the brain and surrounding
structures were obtained without and with intravenous contrast.
CONTRAST:  18mL MULTIHANCE GADOBENATE DIMEGLUMINE 529 MG/ML IV SOLN

[Series 2: T1 · sagittal · 5.0mm · 0.45mm/px · 1 of 21 slices shown]
[im 1/21]
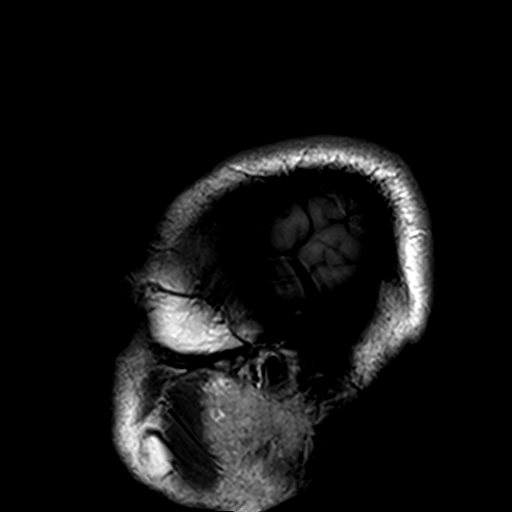

[Series 3: DWI · axial · 3.0mm · 1.80mm/px · z∈[-70,+76]mm · 6 of 99 slices shown (1 of 4)]
[im 1/99]
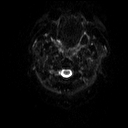
[im 20/99]
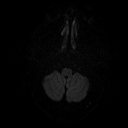
[im 40/99]
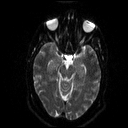
[im 59/99]
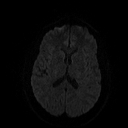
[im 79/99]
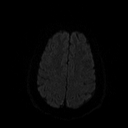
[im 99/99]
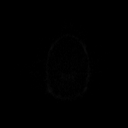

[Series 4: DWI · axial · 3.0mm · 1.80mm/px · z∈[-70,+76]mm · 3 of 48 slices shown (2 of 4)]
[im 1/48]
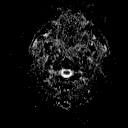
[im 24/48]
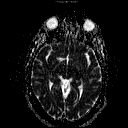
[im 48/48]
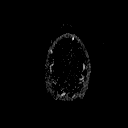

[Series 5: DWI · coronal · 5.0mm · 1.80mm/px · 5 of 68 slices shown (3 of 4)]
[im 1/68]
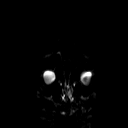
[im 17/68]
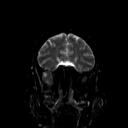
[im 34/68]
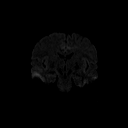
[im 51/68]
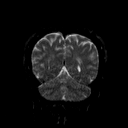
[im 68/68]
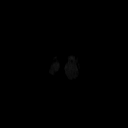

[Series 6: DWI · coronal · 5.0mm · 1.80mm/px · 2 of 34 slices shown (4 of 4)]
[im 1/34]
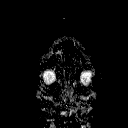
[im 34/34]
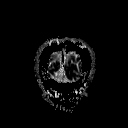

[Series 7: T2 · axial · 5.0mm · 0.51mm/px · 1 of 22 slices shown (1 of 2)]
[im 1/22]
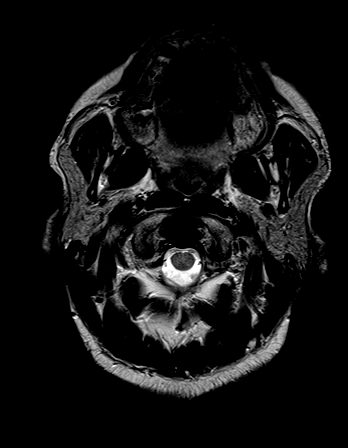

[Series 8: FLAIR · axial · 3.0mm · 0.45mm/px · z∈[-68,+75]mm · 2 of 32 slices shown (1 of 2)]
[im 1/32]
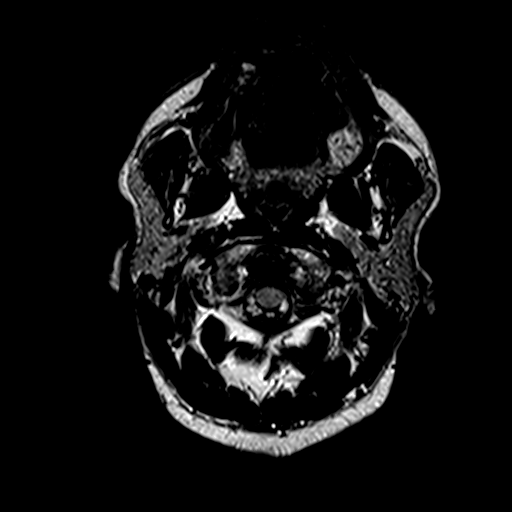
[im 32/32]
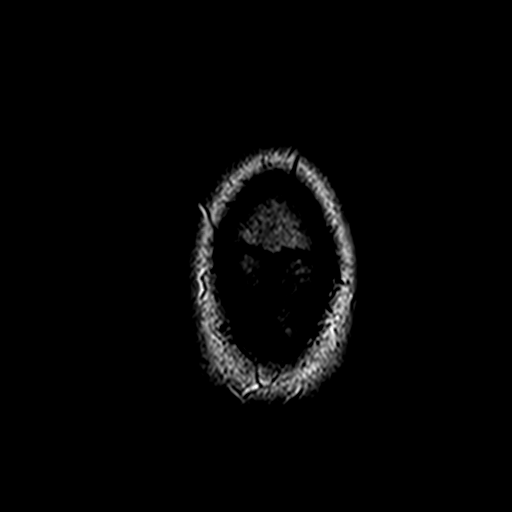

[Series 10: swi_images · axial · 4.0mm · 0.90mm/px · z∈[-67,+73]mm · 2 of 36 slices shown]
[im 1/36]
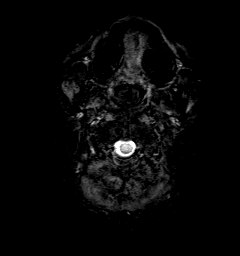
[im 36/36]
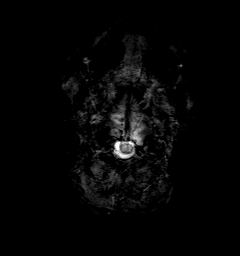

[Series 11: t1_mpr_tra · axial · 1.0mm · 0.75mm/px · z∈[-69,+73]mm · 10 of 144 slices shown (1 of 2)]
[im 1/144]
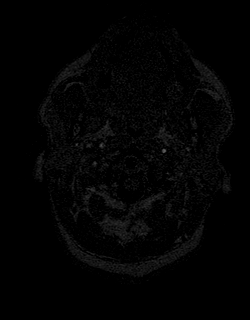
[im 16/144]
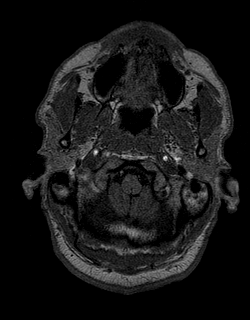
[im 32/144]
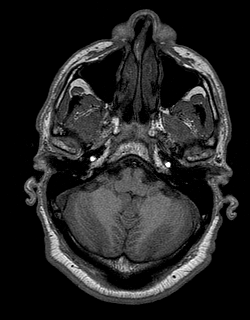
[im 48/144]
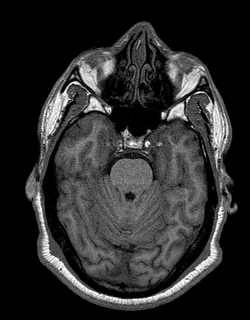
[im 64/144]
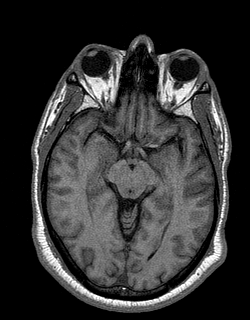
[im 80/144]
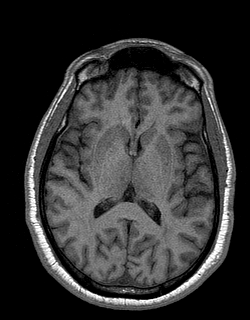
[im 96/144]
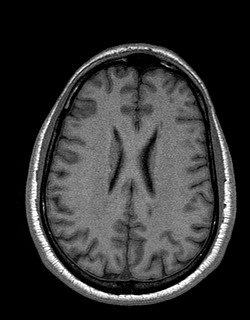
[im 112/144]
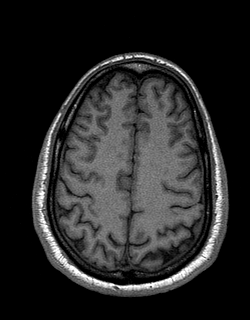
[im 128/144]
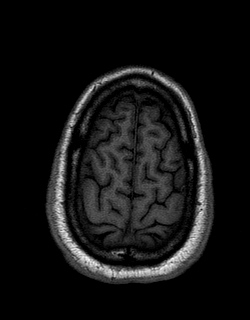
[im 144/144]
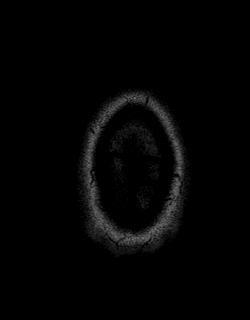

[Series 12: FLAIR · sagittal · 5.0mm · 0.45mm/px · 2 of 25 slices shown (2 of 2)]
[im 1/25]
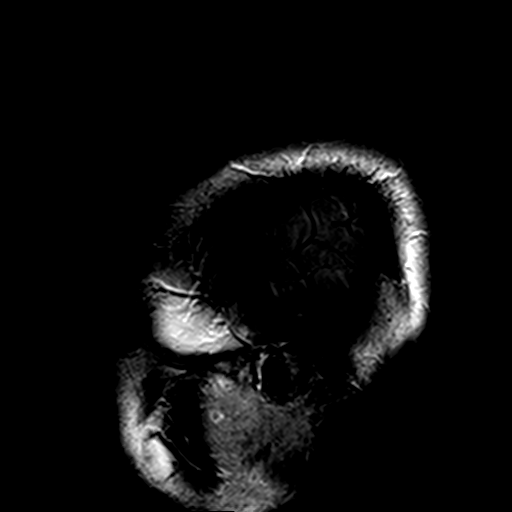
[im 25/25]
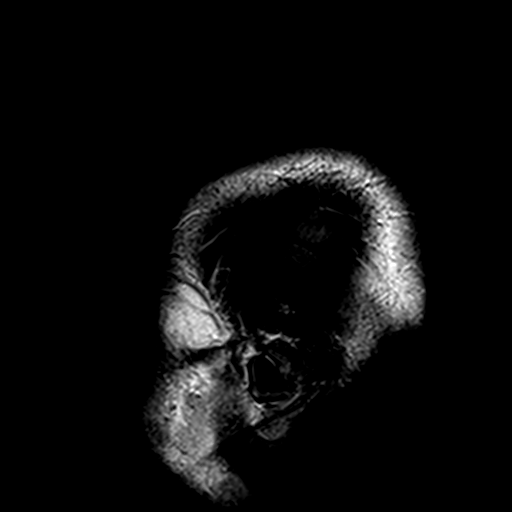

[Series 13: T2 · coronal · 5.0mm · 0.45mm/px · 2 of 25 slices shown (2 of 2)]
[im 1/25]
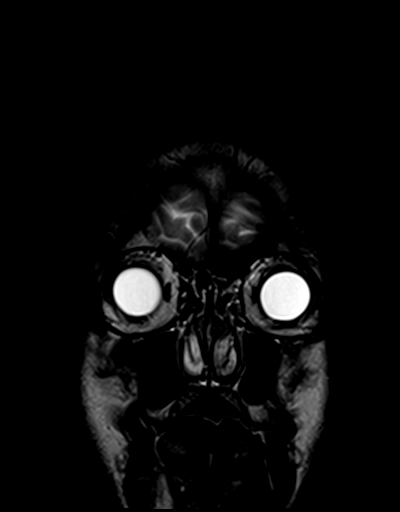
[im 25/25]
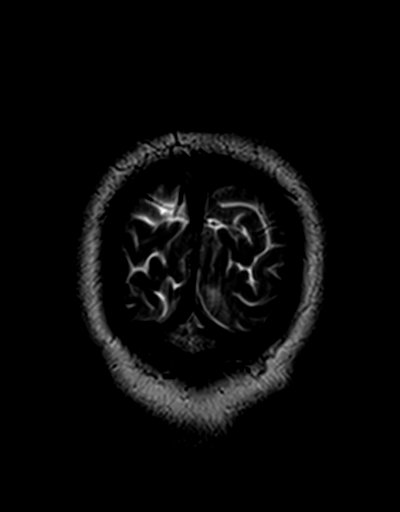

[Series 14: t1_mpr_tra · axial · 1.0mm · 0.75mm/px · z∈[-69,+73]mm · 10 of 144 slices shown (2 of 2)]
[im 1/144]
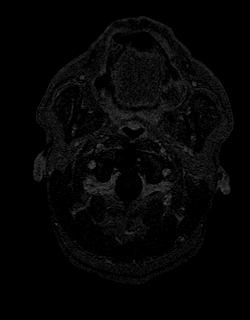
[im 16/144]
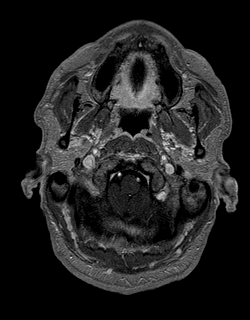
[im 32/144]
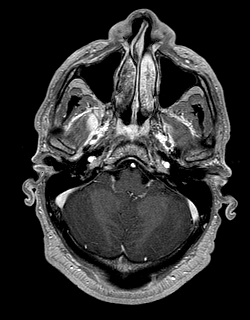
[im 48/144]
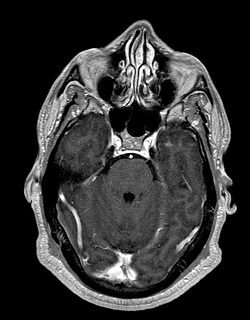
[im 64/144]
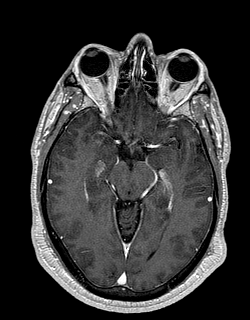
[im 80/144]
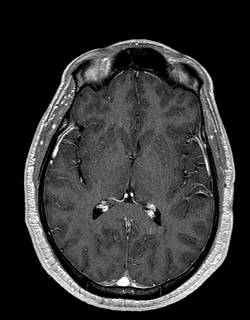
[im 96/144]
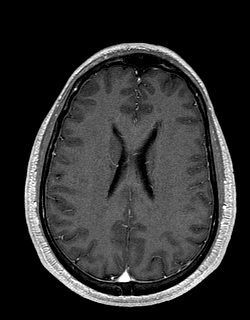
[im 112/144]
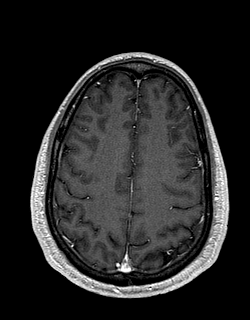
[im 128/144]
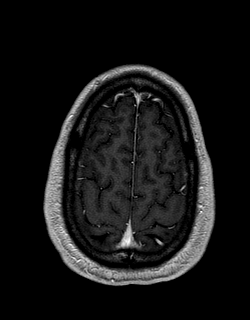
[im 144/144]
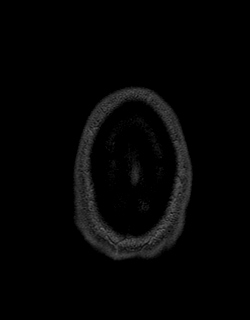

[Series 15: post cor · coronal · 5.0mm · 0.45mm/px · 2 of 25 slices shown]
[im 1/25]
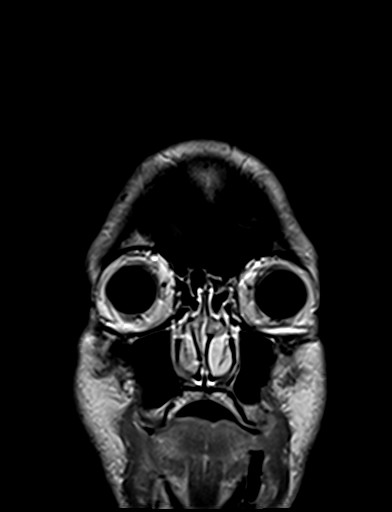
[im 25/25]
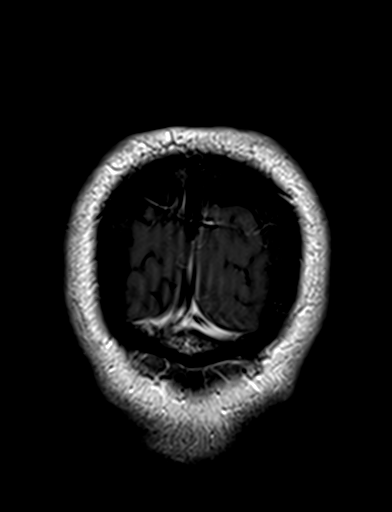

[48 of 48 positions shown; findings below may reference images not displayed]

FINDINGS: Brain: Diffusion imaging does not show any acute or subacute
infarction or other cause of restricted diffusion. Brainstem and
cerebellum are normal. Within the cerebral hemispheres, there are
few foci of T2 and FLAIR signal within the deep and subcortical
white matter, most numerous in the forceps major regions. In this
demographic, a more common etiology would be small-vessel disease
rather than demyelinating disease/multiple sclerosis, though that is
not excluded on the basis of the imaging. No foci show abnormal
contrast enhancement. No mass lesion, hemorrhage, hydrocephalus or
extra-axial collection.

Vascular: Major vessels at the base of the brain show flow.

Skull and upper cervical spine: Negative

Sinuses/Orbits: Clear/normal

Other: None
IMPRESSION: No acute finding by MRI. Few scattered foci of T2 and FLAIR signal
within the cerebral hemispheric white matter, most numerous in the
forceps major regions. In this demographic, a more common etiology
would be small-vessel disease rather than demyelinating
disease/multiple sclerosis, though that is not excluded on the basis
of the imaging. Precise comparison cannot be made with the outside
study report.

## 2021-11-28 ENCOUNTER — Other Ambulatory Visit: Payer: Self-pay

## 2021-11-28 ENCOUNTER — Encounter (HOSPITAL_COMMUNITY): Payer: Self-pay | Admitting: Emergency Medicine

## 2021-11-28 ENCOUNTER — Ambulatory Visit (HOSPITAL_COMMUNITY)
Admission: EM | Admit: 2021-11-28 | Discharge: 2021-11-28 | Disposition: A | Discharge status: Home / Self Care | Payer: Medicaid Other | Attending: Family Medicine | Admitting: Family Medicine

## 2021-11-28 DIAGNOSIS — Z20822 Contact with and (suspected) exposure to covid-19: Secondary | ICD-10-CM | POA: Insufficient documentation

## 2021-11-28 NOTE — ED Provider Notes (Signed)
?[facility] ? ? ? ?CSN: [id number] ?Arrival date & time: 11/28/21  1634 ? ? ?  ? ?History   ?Chief Complaint ?Chief Complaint  ?Patient presents with  ? Covid Exposure  ? ? ?HPI ?Tanner Camacho is a 61 y.o. male.  ? ?HPI ?Here for COVID exposure.  He has been exposed to a coworker all week that has tested positive for COVID today that coworker has been asymptomatic.  He was exposed as long ago as 5 days ago. ? ?Patient has MS and takes Aubagio; he is therefore immunosuppressed ? ?Past Medical History:  ?Diagnosis Date  ? Alcohol abuse   ? Multiple sclerosis (HCC)   ? Neuromuscular disorder (HCC)   ? M.S.  ? Substance abuse (HCC)   ? ? ?Patient Active Problem List  ? Diagnosis Date Noted  ? 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 Multiple sclerosis (HCC) 04/14/2017  ? ? ?Past Surgical History:  ?Procedure Laterality Date  ? BACK SURGERY    ? cyst removed from back   ? ? ? ? ? ?Home Medications   ? ?Prior to Admission medications   ?Medication Sig Start Date End Date Taking? Authorizing Provider  ?gabapentin (NEURONTIN) 300 MG capsule Take 2 capsules (600 mg total) by mouth 3 (three) times daily. 05/13/19   Drema Dallas, DO  ?methocarbamol (ROBAXIN) 500 MG tablet Take 1 tablet (500 mg total) by mouth every 8 (eight) hours as needed for muscle spasms. 02/15/21   Haskel Schroeder, PA-C  ?tamsulosin (FLOMAX) 0.4 MG CAPS capsule TAKE 1 CAPSULE BY MOUTH ONCE DAILY 30 MINUTES AFTER THE SAME MEAL EACH DAY 04/01/19   [provider]  ?Teriflunomide (AUBAGIO) 14 MG TABS Take by mouth. Unsure if he takes once or twice a day 02/10/19   [provider]  ? ? ?Family History ?Family History  ?Problem Relation Age of Onset  ? Dementia Mother   ? Multiple sclerosis Cousin   ? Colon cancer Neg Hx   ? Colon polyps Neg Hx   ? Esophageal cancer Neg Hx   ? Rectal cancer Neg Hx   ? Stomach cancer Neg Hx   ? ? ?Social History ?Social History  ? ?Tobacco Use  ? Smoking status: Every Day  ?  Packs/day: 0.25  ?  Types:  Cigarettes  ? Smokeless tobacco: Never  ? Tobacco comments:  ?  5 a day   ?Substance Use Topics  ? Alcohol use: Not Currently  ? Drug use: Not Currently  ?  Types: Cocaine  ? ? ? ?Allergies   ?Patient has no known allergies. ? ? ?Review of Systems ?Review of Systems ? ? ?Physical Exam ?Triage Vital Signs ?ED Triage Vitals  ?Enc Vitals Group  ?   BP 11/28/21 1724 138/90  ?   Pulse Rate 11/28/21 1724 78  ?   Resp 11/28/21 1724 16  ?   Temp 11/28/21 1724 98.5 ?F (36.9 ?C)  ?   Temp Source 11/28/21 1724 Oral  ?   SpO2 11/28/21 1724 97 %  ?   Weight --   ?   Height --   ?   Head Circumference --   ?   Peak Flow --   ?   Pain Score 11/28/21 1722 0  ?   Pain Loc --   ?   Pain Edu? --   ?   Excl. in GC? --   ? ?No data found. ? ?Updated Vital Signs ?BP 138/90 (BP Location: Left  Arm)   Pulse 78   Temp 98.5 ?F (36.9 ?C) (Oral)   Resp 16   SpO2 97%  ? ?Visual Acuity ?Right Eye Distance:   ?Left Eye Distance:   ?Bilateral Distance:   ? ?Right Eye Near:   ?Left Eye Near:    ?Bilateral Near:    ? ?Physical Exam ?Vitals reviewed.  ?Constitutional:   ?   General: He is not in acute distress. ?   Appearance: He is not toxic-appearing.  ?HENT:  ?   Right Ear: Tympanic membrane and ear canal normal.  ?   Left Ear: Tympanic membrane and ear canal normal.  ?   Nose: Nose normal.  ?   Mouth/Throat:  ?   Mouth: Mucous membranes are moist.  ?   Pharynx: No oropharyngeal exudate or posterior oropharyngeal erythema.  ?Eyes:  ?   Extraocular Movements: Extraocular movements intact.  ?   Conjunctiva/sclera: Conjunctivae normal.  ?   Pupils: Pupils are equal, round, and reactive to light.  ?Cardiovascular:  ?   Rate and Rhythm: Normal rate and regular rhythm.  ?   Heart sounds: No murmur heard. ?Pulmonary:  ?   Effort: Pulmonary effort is normal.  ?   Breath sounds: Normal breath sounds.  ?Musculoskeletal:  ?   Cervical back: Neck supple.  ?Lymphadenopathy:  ?   Cervical: No cervical adenopathy.  ?Skin: ?   Capillary Refill: Capillary  refill takes less than 2 seconds.  ?   Coloration: Skin is not jaundiced or pale.  ?Neurological:  ?   General: No focal deficit present.  ?   Mental Status: He is alert and oriented to person, place, and time.  ?Psychiatric:     ?   Behavior: Behavior normal.  ? ? ? ?UC Treatments / Results  ?Labs ?(all labs ordered are listed, but only abnormal results are displayed) ?Labs Reviewed  ?SARS CORONAVIRUS 2 (TAT 6-24 HRS)  ? ? ?EKG ? ? ?Radiology ?No results found. ? ?Procedures ?Procedures (including critical care time) ? ?Medications Ordered in UC ?Medications - No data to display ? ?Initial Impression / Assessment and Plan / UC Course  ?I have reviewed the triage vital signs and the nursing notes. ? ?Pertinent labs & imaging results that were available during my care of the patient were reviewed by me and considered in my medical decision making (see chart for details). ? ?  ? ?COVID swab for him today.  Staff will call him if positive; he is at risk for severe COVID with his immunosuppression, so if he is positive for COVID, he should have a prescription for Paxlovid, unless it interacts with his medications.  His kidney function is normal in epic.  If there are interactions with Paxlovid and his medications, then he should have a prescription for molnupiravir.  He also asked about a new neurologist.  I asked him to speak with his primary doctor about that.  He is established with a neurologist in [place] ?Final Clinical Impressions(s) / UC Diagnoses  ? ?Final diagnoses:  ?Exposure to COVID-19 virus  ? ? ? ?Discharge Instructions   ? ?  ? ?You have been swabbed for COVID, and the test will result in the next 24 hours. Our staff will call you if positive. If the test is positive, you should quarantine for 5 days.  ? ? ? ? ?ED Prescriptions   ?None ?  ? ?PDMP not reviewed this encounter. ?  ?Zenia Resides, MD ?11/28/21 1752 ? ?

## 2021-11-28 NOTE — ED Triage Notes (Signed)
Coworker tested positive for covid today. Pt scared and wants testing. Denies any s/s.  ?

## 2021-11-28 NOTE — Discharge Instructions (Signed)
°  You have been swabbed for COVID, and the test will result in the next 24 hours. Our staff will call you if positive. If the test is positive, you should quarantine for 5 days.  °

## 2021-11-29 LAB — SARS CORONAVIRUS 2 (TAT 6-24 HRS): SARS Coronavirus 2: NEGATIVE

## 2021-12-31 ENCOUNTER — Ambulatory Visit: Payer: Medicaid Other | Admitting: Neurology

## 2021-12-31 ENCOUNTER — Encounter: Payer: Self-pay | Admitting: Neurology

## 2021-12-31 VITALS — BP 137/85 | HR 75 | Ht 72.0 in | Wt 188.5 lb

## 2021-12-31 DIAGNOSIS — R208 Other disturbances of skin sensation: Secondary | ICD-10-CM | POA: Diagnosis not present

## 2021-12-31 DIAGNOSIS — R9082 White matter disease, unspecified: Secondary | ICD-10-CM | POA: Diagnosis not present

## 2021-12-31 DIAGNOSIS — G35 Multiple sclerosis: Secondary | ICD-10-CM

## 2021-12-31 DIAGNOSIS — R269 Unspecified abnormalities of gait and mobility: Secondary | ICD-10-CM

## 2021-12-31 DIAGNOSIS — G9589 Other specified diseases of spinal cord: Secondary | ICD-10-CM | POA: Diagnosis not present

## 2021-12-31 MED ORDER — LAMOTRIGINE 25 MG PO TABS: ORAL_TABLET | ORAL | 5 refills | Status: DC

## 2021-12-31 MED ORDER — GABAPENTIN 300 MG PO CAPS: 600.0000 mg | ORAL_CAPSULE | Freq: Three times a day (TID) | ORAL | 11 refills | Status: AC

## 2021-12-31 NOTE — Progress Notes (Signed)
? ?[facility] ? ?PATIENT: Tanner Camacho ?DOB: 1961-03-30 ? ?REFERRING DOCTOR OR PCP:  Valerie Roys, NP ?SOURCE: Patient, notes from Dr. Everlena Cooper, notes from [facility] neurology, imaging and lab reports, MRI images personally reviewed. ? ?_________________________________ ? ? ?HISTORICAL ? ?CHIEF COMPLAINT:  ?Chief Complaint  ?Patient presents with  ? New Patient (Initial Visit)  ?  RM 2, alone. Paper referral for MS TOC. Dx with MS 2016. First placed on Avonex (caused depression, dry mouth),  Rebif (had SE-headaches).  Had MRI brain at [facility] 01/2021. In care everywhere. Dr. Everlena Cooper told him he did not MS after completing MRI w/ him and wanted him to go off DMT. Pt here because traveling to [facility] was getting to be too far. Wanted to establish with local neurologist.  ? ? ?HISTORY OF PRESENT ILLNESS:  ?I had the pleasure of seeing your patient, Tanner Camacho, at the [facility] for neurologic consultation regarding his diagnosis of multiple sclerosis. ? ?He is a 61 year old man who was diagnosed with MS in 2016 when he woke up in his sleep hurting in the arms and legs.    In retrospect, he may have had some symptoms with pain and numbness a year or 2 earlier.  He felt that gait was also worse in 2016.  He was incarcerated at the time and saw the doctor that day  He then went to a hospital in [place] and had an MRI and was told he had MS.   He was sent to neurology at [facility]  He was diagnosed with MS.  At first he was placed on Avonex but he stopped due to depression and headaches.   He then was placed on Rebif but had similar side effects and was switched to Aubagio.    He had some fluctuating neurologic symptoms over the next couple of years.  Reports he had blurred vision a couple times and drop foot another time.   He was initially followed at [facility] and then sore Dr. Everlena Cooper in 2020 and 2021.  Repeat MRI of the brain and cervical spine 08/15/2019 was probably  unchanged but alternative diagnoses (chronic microvascular ischemic change in the brain and myelopathic signal in the spine) was suggested.  Therefore, a lumbar puncture was performed.  This CSF did not show any oligoclonal bands.  Stopping Aubagio was discussed but because the diagnosis was still not completely clear it was continued. ? ?I personally reviewed the MRI.  The bilateral gray matter changes at C5-C6 are much more consistent with mild myelomalacia (from transient compression) rather than demyelination.  The MRI of the brain does show some foci in the periventricular white matter but the vast majority of lesions are in the deep white matter and pericallosal white matter. ? ?He does not recall ant injury involving his neck.   However, he has had some falls.  He has a history of frequent cocaine use in the past. ? ?He is on gabapentin for dysesthesias and feels it helps a little bit.  He was on baclofen in the past and does not think it helped much. ? ?Vascular risks:  He had HTN once but not on med's now.   He smokes 1/2 pack a week and never really smoked much more in the past.   He does not have DM.    History of cocaine abuse ? ?Imaging:  ?MRI of the head 08/15/2019 showed multiple T2/FLAIR hyperintense foci in the hemispheres with small confluencies in  the periatrial white matter.  The majority of the foci were in the deep white matter in the pericallosal white matter.  A couple small foci are in the periventricular white matter.  A few are in the subcortical white matter.  There are no infratentorial lesions. ? ?MRI of the thoracic spine 09/19/2019 shows a normal spinal cord.  There is a small disc bulge at T3-T4.  There is a left paramedian disc protrusion at T7-T8 and facet hypertrophy at T8-T9.  There is no spinal stenosis or nerve root compression. ? ?MRI of the cervical spine 07/25/2019 shows: Within the spinal cord adjacent to C5-C6, there are T2 hyperintense foci bilaterally within the central  gray matter with a "Owl's eyes" appearance.  Although there is mild spinal stenosis, there does not appear to be spinal cord compression at this level.  Possible subtle focus adjacent to C3-C4 seen only on the sagittal views could also be artifact.  There is spinal stenosis at C3-C4, C4-C5 and C5-C6.  There does not appear to be nerve root compression. ? ? ? ?REVIEW OF SYSTEMS: ?Constitutional: No fevers, chills, sweats, or change in appetite ?Eyes: No visual changes, double vision, eye pain ?Ear, nose and throat: No hearing loss, ear pain, nasal congestion, sore throat ?Cardiovascular: No chest pain, palpitations ?Respiratory:  No shortness of breath at rest or with exertion.   No wheezes ?GastrointestinaI: No nausea, vomiting, diarrhea, abdominal pain, fecal incontinence ?Genitourinary:  No dysuria, urinary retention or frequency.  No nocturia. ?Musculoskeletal:  No neck pain, back pain ?Integumentary: No rash, pruritus, skin lesions ?Neurological: as above ?Psychiatric: No depression at this time.  No anxiety ?Endocrine: No palpitations, diaphoresis, change in appetite, change in weigh or increased thirst ?Hematologic/Lymphatic:  No anemia, purpura, petechiae. ?Allergic/Immunologic: No itchy/runny eyes, nasal congestion, recent allergic reactions, rashes ? ?ALLERGIES: ?No Known Allergies ? ?HOME MEDICATIONS: ? ?Current Outpatient Medications:  ?  lamoTRIgine (LAMICTAL) 25 MG tablet, Take 1 po qd x 5 days, then 1 po bid x 5 days, then 1 po tid x 5 days, then 2 po bid, Disp: 120 tablet, Rfl: 5 ?  tamsulosin (FLOMAX) 0.4 MG CAPS capsule, TAKE 1 CAPSULE BY MOUTH ONCE DAILY 30 MINUTES AFTER THE SAME MEAL EACH DAY, Disp: , Rfl:  ?  Teriflunomide (AUBAGIO) 14 MG TABS, Take by mouth. Unsure if he takes once or twice a day, Disp: , Rfl:  ?  gabapentin (NEURONTIN) 300 MG capsule, Take 2 capsules (600 mg total) by mouth 3 (three) times daily., Disp: 180 capsule, Rfl: 11 ? ?PAST MEDICAL HISTORY: ?Past Medical History:   ?Diagnosis Date  ? Alcohol abuse   ? Multiple sclerosis (HCC)   ? Neuromuscular disorder (HCC)   ? M.S.  ? Substance abuse (HCC)   ? ? ?PAST SURGICAL HISTORY: ?Past Surgical History:  ?Procedure Laterality Date  ? BACK SURGERY    ? cyst removed from back   ? ? ?FAMILY HISTORY: ?Family History  ?Problem Relation Age of Onset  ? Dementia Mother   ? High blood pressure Mother   ? High blood pressure Father   ? Multiple sclerosis Cousin   ? Colon cancer Neg Hx   ? Colon polyps Neg Hx   ? Esophageal cancer Neg Hx   ? Rectal cancer Neg Hx   ? Stomach cancer Neg Hx   ? ? ?SOCIAL HISTORY: ? ?Social History  ? ?Socioeconomic History  ? Marital status: Single  ?  Spouse name: Not on file  ? Number of  children: Not on file  ? Years of education: Not on file  ? Highest education level: Not on file  ?Occupational History  ? Not on file  ?Tobacco Use  ? Smoking status: Every Day  ?  Packs/day: 0.25  ?  Types: Cigarettes  ? Smokeless tobacco: Never  ? Tobacco comments:  ?  5 a day   ?Vaping Use  ? Vaping Use: Not on file  ?Substance and Sexual Activity  ? Alcohol use: Not Currently  ? Drug use: Not Currently  ?  Types: Cocaine  ? Sexual activity: Not on file  ?Other Topics Concern  ? Not on file  ?Social History Narrative  ? Lives in shelter  ? Coffee - 3 cup per day  ? Right handed  ? ?Social Determinants of Health  ? ?Financial Resource Strain: Not on file  ?Food Insecurity: Not on file  ?Transportation Needs: Not on file  ?Physical Activity: Not on file  ?Stress: Not on file  ?Social Connections: Not on file  ?Intimate Partner Violence: Not on file  ? ? ? ?PHYSICAL EXAM ? ?Vitals:  ? 12/31/21 1314  ?BP: 137/85  ?Pulse: 75  ?Weight: 188 lb 8 oz (85.5 kg)  ?Height: 6' (1.829 m)  ? ? ?Body mass index is 25.57 kg/m?. ? ? ?General: The patient is well-developed and well-nourished and in no acute distress ? ?HEENT:  Head is Foley/AT.  Sclera are anicteric.  Funduscopic exam shows normal optic discs and retinal vessels. ? ?Neck: No  carotid bruits are noted.  The neck is nontender. ? ?Cardiovascular: The heart has a regular rate and rhythm with a normal S1 and S2. There were no murmurs, gallops or rubs.   ? ?Skin: Extremities are without rash

## 2021-12-31 NOTE — Patient Instructions (Signed)
The pharmacy has the prescription of lamotrigine 25 mg. Please take it as follows: ?For 5 days take 1 pill once a day. ?For next 5 days take 1 pill twice a day. ?For next 5 days, take 1 pill in the morning and 2 at bedtime or evening. ?Then take 2 pills twice a day. ? ?Most people tolerate lamotrigine very well. Some will get a rash.  ?If you do get a significant rash stop the medicine immediately and let us know. ? ?

## 2022-05-19 IMAGING — DX DG HAND COMPLETE 3+V*R*
3 series · 3 of 3 positions shown · non-contrast
Comparison: None.

CLINICAL DATA: Right hand pain radiating about the digits, no
injury, woke with pain

EXAM:
RIGHT HAND - COMPLETE 3+ VIEW

[hand pa]
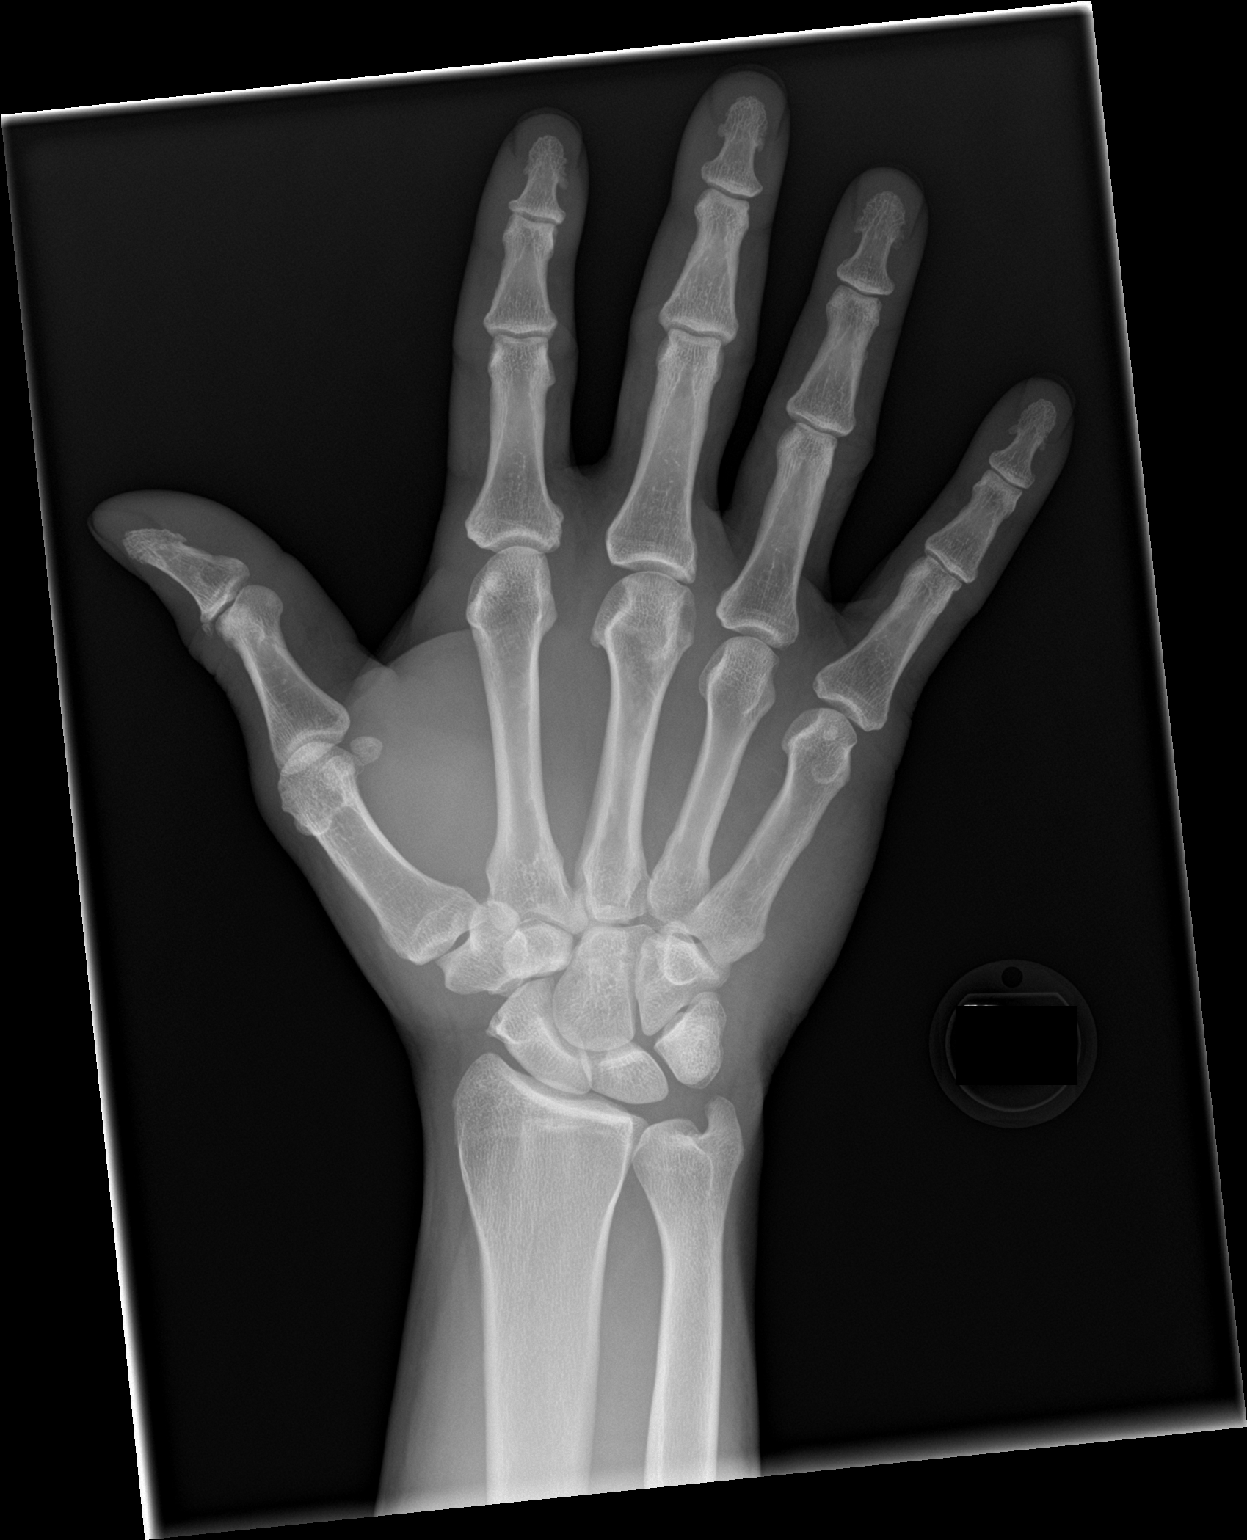

[hand obl]
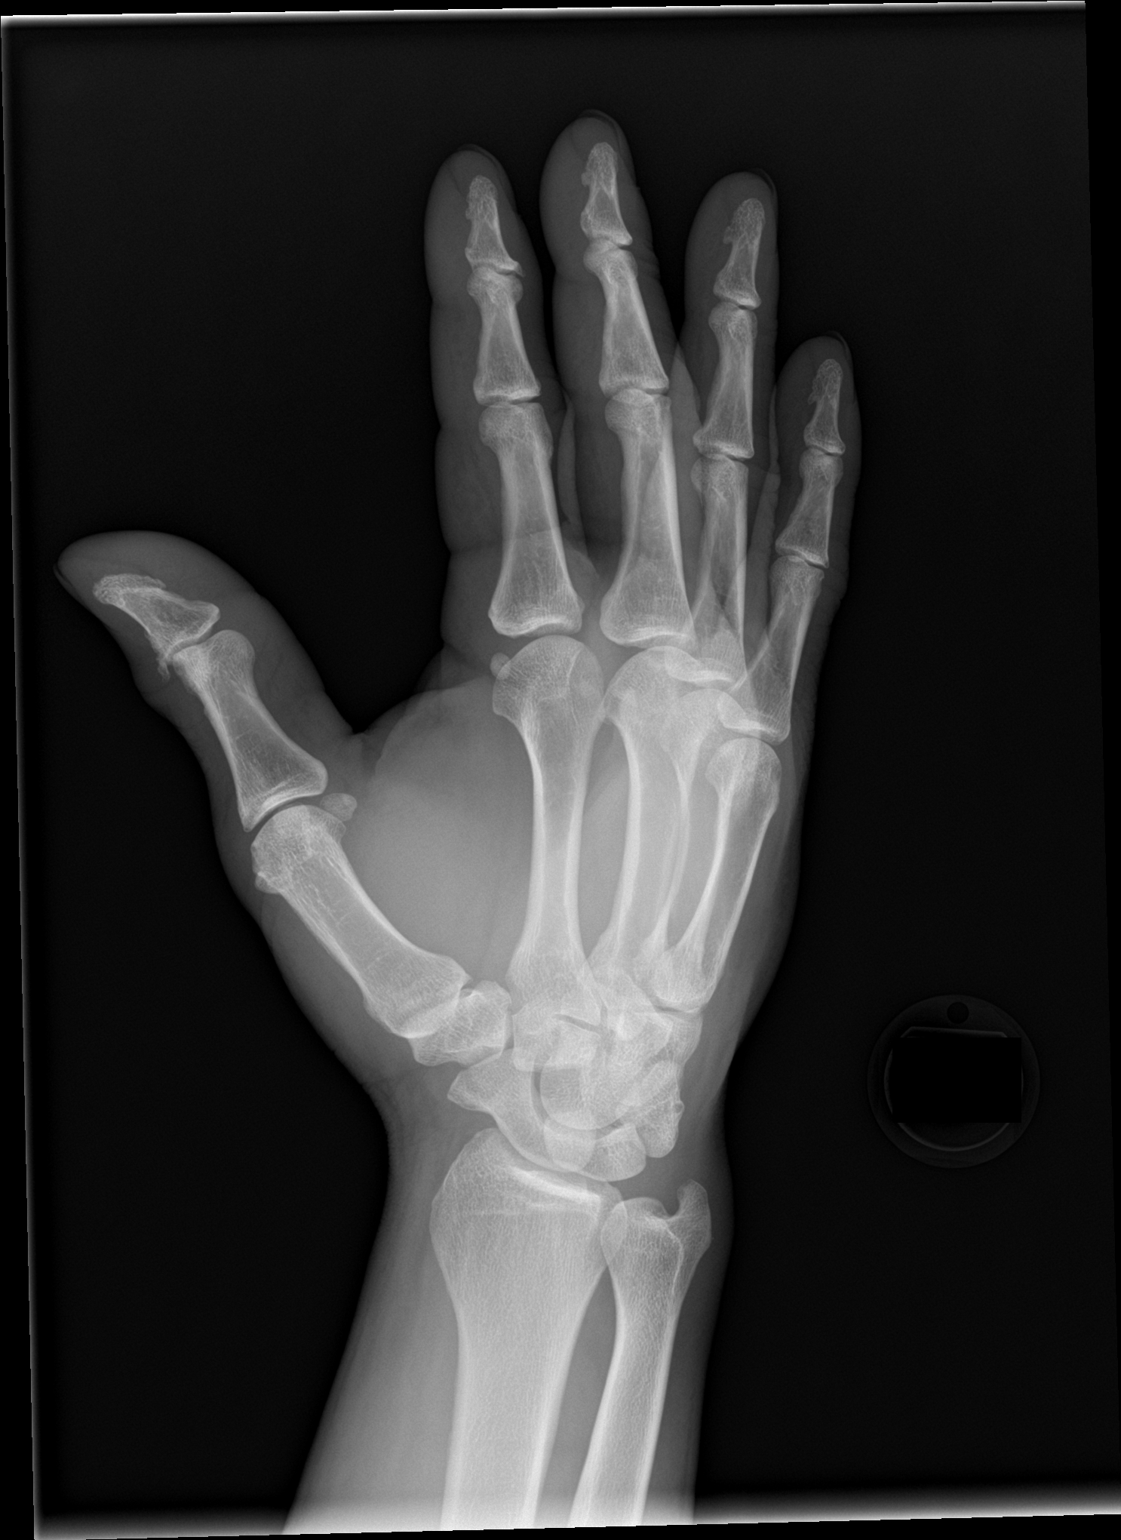

[hand lat]
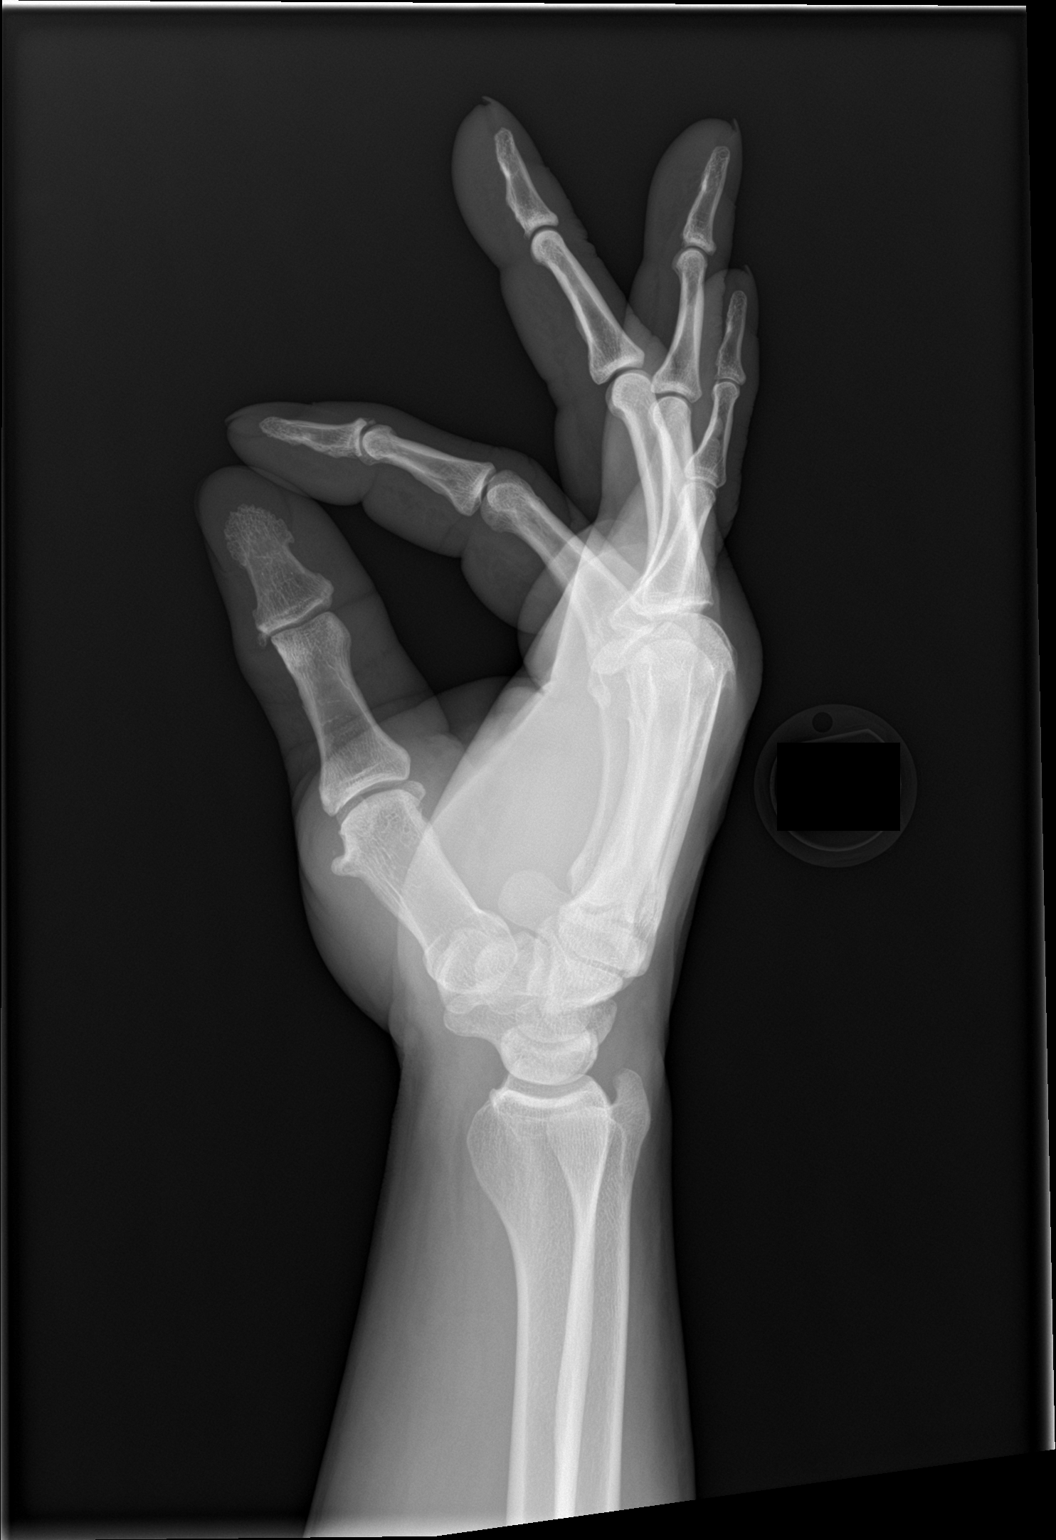

[3 of 3 positions shown; findings below may reference images not displayed]

FINDINGS: No acute bony abnormality. Specifically, no fracture, subluxation,
or dislocation. Mild diffuse interphalangeal arthrosis including a
corticated, fragmented osteophyte at the first interphalangeal
joint. Mild degenerative change in spurring at the intercarpal
articulations as well most pronounced at the triscaphe joint. Normal
bone mineralization. No worrisome osseous lesions. Soft tissues are
unremarkable without gas or focal nodularity.
IMPRESSION: At most mild interphalangeal arthrosis and intercarpal degenerative
change most pronounced at the triscaphe joint.

No acute osseous or soft tissue abnormality.

## 2023-01-05 ENCOUNTER — Ambulatory Visit: Payer: Medicaid Other

## 2023-03-30 ENCOUNTER — Ambulatory Visit (HOSPITAL_COMMUNITY)
Admission: EM | Admit: 2023-03-30 | Discharge: 2023-03-30 | Disposition: A | Discharge status: Home / Self Care | Payer: No Typology Code available for payment source | Attending: Emergency Medicine | Admitting: Emergency Medicine

## 2023-03-30 ENCOUNTER — Encounter (HOSPITAL_COMMUNITY): Payer: Self-pay

## 2023-03-30 DIAGNOSIS — R0981 Nasal congestion: Secondary | ICD-10-CM | POA: Diagnosis not present

## 2023-03-30 DIAGNOSIS — Z20822 Contact with and (suspected) exposure to covid-19: Secondary | ICD-10-CM | POA: Insufficient documentation

## 2023-03-30 LAB — SARS CORONAVIRUS 2 (TAT 6-24 HRS): SARS Coronavirus 2: POSITIVE — AB

## 2023-03-30 NOTE — Discharge Instructions (Signed)
We will call you if your covid test returns positive.   I recommend to try a nasal spray such as flonase (fluticasone) You can also use the nasal saline rinse you have at home  Drink lots of fluids!

## 2023-03-30 NOTE — ED Provider Notes (Signed)
[facility]    CSN: [id number] Arrival date & time: 03/30/23  0935      History   Chief Complaint Chief Complaint  Patient presents with   Cough    HPI Tanner Camacho is a 62 y.o. male.  Presents for COVID test 3 positive exposures at work over the last week. This morning he woke up with some nasal congestion and wanted to be tested. He has no other symptoms.  No fever or cough.  No shortness of breath.  No medicines attempted yet.  Past Medical History:  Diagnosis Date   Alcohol abuse    Multiple sclerosis (HCC)    Neuromuscular disorder (HCC)    M.S.   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 Authorizing Provider  gabapentin (NEURONTIN) 300 MG capsule Take 2 capsules (600 mg total) by mouth 3 (three) times daily. 12/31/21   Sater, Pearletha Furl, MD  lamoTRIgine (LAMICTAL) 25 MG tablet Take 1 po qd x 5 days, then 1 po bid x 5 days, then 1 po tid x 5 days, then 2 po bid 12/31/21   Sater, Pearletha Furl, MD  tamsulosin (FLOMAX) 0.4 MG CAPS capsule TAKE 1 CAPSULE BY MOUTH ONCE DAILY 30 MINUTES AFTER THE SAME MEAL EACH DAY 04/01/19   [provider]  Teriflunomide (AUBAGIO) 14 MG TABS Take by mouth. Unsure if he takes once or twice a day 02/10/19   [provider]    Family History Family History  Problem Relation Age of Onset   Dementia Mother    High blood pressure Mother    High blood pressure Father    Multiple sclerosis Cousin    Colon cancer Neg Hx    Colon polyps Neg Hx    Esophageal cancer Neg Hx    Rectal cancer Neg Hx    Stomach cancer Neg Hx     Social History Social History   Tobacco Use   Smoking status: Every Day     Current packs/day: 0.25    Types: Cigarettes   Smokeless tobacco: Never   Tobacco comments:    5 a day   Substance Use Topics   Alcohol use: Not Currently   Drug use: Not Currently    Types: Cocaine     Allergies   Patient has no known allergies.   Review of Systems Review of Systems Per HPI  Physical Exam Triage Vital Signs ED Triage Vitals [03/30/23 1007]  Encounter Vitals Group     BP (!) 149/93     Systolic BP Percentile      Diastolic BP Percentile      Pulse Rate (!) 57     Resp 16     Temp 98.3 F (36.8 C)     Temp Source Oral     SpO2 95 %     Weight 198 lb (89.8 kg)     Height 6' (1.829 m)     Head Circumference      Peak Flow      Pain Score 0     Pain Loc      Pain  Education      Exclude from Growth Chart    No data found.  Updated Vital Signs BP (!) 149/93 (BP Location: Left Arm)   Pulse (!) 57   Temp 98.3 F (36.8 C) (Oral)   Resp 16   Ht 6' (1.829 m)   Wt 198 lb (89.8 kg)   SpO2 95%   BMI 26.85 kg/m    Physical Exam Vitals and nursing note reviewed.  Constitutional:      General: He is not in acute distress. HENT:     Nose: No congestion or rhinorrhea.     Mouth/Throat:     Mouth: Mucous membranes are moist.     Pharynx: Oropharynx is clear. No posterior oropharyngeal erythema.  Eyes:     Conjunctiva/sclera: Conjunctivae normal.  Cardiovascular:     Rate and Rhythm: Normal rate and regular rhythm.     Heart sounds: Normal heart sounds.  Pulmonary:     Effort: Pulmonary effort is normal.     Breath sounds: Normal breath sounds.  Musculoskeletal:     Cervical back: Normal range of motion.  Skin:    General: Skin is warm and dry.  Neurological:     Mental Status: He is alert and oriented to person, place, and time.    UC Treatments / Results  Labs (all labs ordered are listed, but only abnormal results are displayed) Labs Reviewed  SARS CORONAVIRUS 2 (TAT 6-24 HRS)    EKG  Radiology No results  found.  Procedures Procedures  Medications Ordered in UC Medications - No data to display  Initial Impression / Assessment and Plan / UC Course  I have reviewed the triage vital signs and the nursing notes.  Pertinent labs & imaging results that were available during my care of the patient were reviewed by me and considered in my medical decision making (see chart for details).  COVID test is pending per patient request.  Discussed the nasal congestion could be this virus, something similar, or allergies etc. Symptomatic care as needed no matter result.  Advised trying once daily nasal spray or nasal saline rinse for symptoms. Patient with no questions at this time, just wanted to be tested. Discussed can return if needed  Final Clinical Impressions(s) / UC Diagnoses   Final diagnoses:  Nasal congestion  Close exposure to COVID-19 virus     Discharge Instructions      We will call you if your covid test returns positive.   I recommend to try a nasal spray such as flonase (fluticasone) You can also use the nasal saline rinse you have at home  Drink lots of fluids!    ED Prescriptions   None    PDMP not reviewed this encounter.   Tenesia Escudero, Lurena Joiner, [place] 03/30/23 1049

## 2023-03-30 NOTE — ED Triage Notes (Signed)
Patient here today with concerns of being exposed to Covid at work. He started having a stuffy nose and a little cough last night and wanted to make sure that he did not have it.

## 2023-04-06 ENCOUNTER — Encounter (HOSPITAL_COMMUNITY): Payer: Self-pay | Admitting: Emergency Medicine

## 2023-04-06 ENCOUNTER — Ambulatory Visit (HOSPITAL_COMMUNITY)
Admission: EM | Admit: 2023-04-06 | Discharge: 2023-04-06 | Disposition: A | Discharge status: Home / Self Care | Payer: No Typology Code available for payment source | Attending: Emergency Medicine | Admitting: Emergency Medicine

## 2023-04-06 DIAGNOSIS — U071 COVID-19: Secondary | ICD-10-CM | POA: Insufficient documentation

## 2023-04-06 NOTE — ED Triage Notes (Signed)
Pt was seen here on 7/22 and was positive for Covid. Pt reports that he needs a negative test before can return to work.

## 2023-04-06 NOTE — Discharge Instructions (Signed)
Prior to CDC you have met quarantine guidelines as you will only need to quarantine if having fever  You have been given a note that you are able to return  Typically do not recommend retesting for COVID within 3 months of a positive result as it can show false positive/false negatives due to antibodies created by the body to protect  May return to urgent care as needed

## 2023-04-06 NOTE — ED Provider Notes (Signed)
[facility]    CSN: [id number] Arrival date & time: 04/06/23  1318      History   Chief Complaint Chief Complaint  Patient presents with   Letter for School/Work    HPI Tanner Camacho is a 62 y.o. male.   Patient presents for requesting note to return to work after testing positive for COVID on 03/30/2023.  Initially experiencing nasal congestion which has resolved.  Able to manage with over-the-counter medications.  Past Medical History:  Diagnosis Date   Alcohol abuse    Multiple sclerosis (HCC)    Neuromuscular disorder (HCC)    M.S.   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 Authorizing Provider  gabapentin (NEURONTIN) 300 MG capsule Take 2 capsules (600 mg total) by mouth 3 (three) times daily. 12/31/21   Sater, Pearletha Furl, MD  lamoTRIgine (LAMICTAL) 25 MG tablet Take 1 po qd x 5 days, then 1 po bid x 5 days, then 1 po tid x 5 days, then 2 po bid 12/31/21   Sater, Pearletha Furl, MD  tamsulosin (FLOMAX) 0.4 MG CAPS capsule TAKE 1 CAPSULE BY MOUTH ONCE DAILY 30 MINUTES AFTER THE SAME MEAL EACH DAY 04/01/19   [provider]  Teriflunomide (AUBAGIO) 14 MG TABS Take by mouth. Unsure if he takes once or twice a day 02/10/19   [provider]    Family History Family History  Problem Relation Age of Onset   Dementia Mother    High blood pressure Mother    High blood pressure Father    Multiple sclerosis Cousin    Colon cancer Neg Hx    Colon polyps Neg Hx    Esophageal cancer Neg Hx    Rectal cancer Neg Hx    Stomach cancer Neg Hx     Social History Social History   Tobacco Use   Smoking status: Every Day    Current  packs/day: 0.25    Types: Cigarettes   Smokeless tobacco: Never   Tobacco comments:    5 a day   Substance Use Topics   Alcohol use: Not Currently   Drug use: Not Currently    Types: Cocaine     Allergies   Patient has no known allergies.   Review of Systems Review of Systems   Physical Exam Triage Vital Signs ED Triage Vitals  Encounter Vitals Group     BP 04/06/23 1330 (!) 154/95     Systolic BP Percentile --      Diastolic BP Percentile --      Pulse Rate 04/06/23 1330 66     Resp 04/06/23 1330 14     Temp 04/06/23 1330 98.2 F (36.8 C)     Temp Source 04/06/23 1330 Oral     SpO2 04/06/23 1330 96 %     Weight --      Height --      Head Circumference --      Peak Flow --      Pain Score 04/06/23 1329 0     Pain Loc --      Pain Education --  Exclude from Growth Chart --    No data found.  Updated Vital Signs BP (!) 154/95 (BP Location: Right Arm)   Pulse 66   Temp 98.2 F (36.8 C) (Oral)   Resp 14   SpO2 96%   Visual Acuity Right Eye Distance:   Left Eye Distance:   Bilateral Distance:    Right Eye Near:   Left Eye Near:    Bilateral Near:     Physical Exam Constitutional:      Appearance: Normal appearance.  Eyes:     Extraocular Movements: Extraocular movements intact.  Pulmonary:     Effort: Pulmonary effort is normal.  Neurological:     Mental Status: He is alert and oriented to person, place, and time. Mental status is at baseline.      UC Treatments / Results  Labs (all labs ordered are listed, but only abnormal results are displayed) Labs Reviewed  SARS CORONAVIRUS 2 (TAT 6-24 HRS)    EKG   Radiology No results found.  Procedures Procedures (including critical care time)  Medications Ordered in UC Medications - No data to display  Initial Impression / Assessment and Plan / UC Course  I have reviewed the triage vital signs and the nursing notes.  Pertinent labs & imaging results that were available during my  care of the patient were reviewed by me and considered in my medical decision making (see chart for details).  COVID-19  Vital signs are stable, patient is in no signs of distress nontoxic-appearing, asymptomatic at this time, his neck quarantine guidelines per CDC therefore given note to return to work, COVID test is pending, discussed with patient typically not recommended due to false positive results, will give note to reflect as needed Final Clinical Impressions(s) / UC Diagnoses   Final diagnoses:  COVID-19     Discharge Instructions      Prior to CDC you have met quarantine guidelines as you will only need to quarantine if having fever  You have been given a note that you are able to return  Typically do not recommend retesting for COVID within 3 months of a positive result as it can show false positive/false negatives due to antibodies created by the body to protect  May return to urgent care as needed   ED Prescriptions   None    PDMP not reviewed this encounter.   Valinda Hoar, NP 04/06/23 1408

## 2024-07-18 ENCOUNTER — Ambulatory Visit (HOSPITAL_COMMUNITY)
Admission: EM | Admit: 2024-07-18 | Discharge: 2024-07-18 | Disposition: A | Discharge status: Home / Self Care | Attending: Physician Assistant | Admitting: Physician Assistant

## 2024-07-18 ENCOUNTER — Encounter (HOSPITAL_COMMUNITY): Payer: Self-pay

## 2024-07-18 DIAGNOSIS — B029 Zoster without complications: Secondary | ICD-10-CM | POA: Diagnosis not present

## 2024-07-18 DIAGNOSIS — R10A2 Flank pain, left side: Secondary | ICD-10-CM | POA: Diagnosis not present

## 2024-07-18 LAB — POCT URINE DIPSTICK
Blood, UA: NEGATIVE
Glucose, UA: NEGATIVE mg/dL
Leukocytes, UA: NEGATIVE
Nitrite, UA: NEGATIVE
POC PROTEIN,UA: 30 — AB
Spec Grav, UA: 1.025 (ref 1.010–1.025)
Urobilinogen, UA: 1 U/dL
pH, UA: 5.5 (ref 5.0–8.0)

## 2024-07-18 MED ORDER — HYDROCODONE-ACETAMINOPHEN 5-325 MG PO TABS: 0.5000 | ORAL_TABLET | Freq: Two times a day (BID) | ORAL | 0 refills | Status: AC | PRN

## 2024-07-18 MED ORDER — VALACYCLOVIR HCL 1 G PO TABS: 1000.0000 mg | ORAL_TABLET | Freq: Three times a day (TID) | ORAL | 0 refills | Status: AC

## 2024-07-18 NOTE — ED Triage Notes (Signed)
 Patient here today with c/o left side flank pain since Tuesday or Wednesday. Patient stated that he tried a Salonpas patch and states that it caused him to have a rash.

## 2024-07-18 NOTE — ED Provider Notes (Signed)
 [facility]    CSN: [id number] Arrival date & time: 07/18/24  9082      History   Chief Complaint Chief Complaint  Patient presents with   Flank Pain    HPI Tanner Camacho is a 63 y.o. male.   Patient presents today with a 6-day history of worsening left flank pain.  He denies any known injury or increase in activity prior to symptom onset.  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  He has tried Tylenol  and ibuprofen  without improvement of symptoms.  He also was given a Salonpas patch which did not provide any relief of symptoms and soon after he applied this he developed a rash in the area.  He has a chickenpox before but has never had shingles.  He believes that he has had shingles vaccines but is not remember specifically and there is not a record of this in EMR.  He does have a history of MS and is on immune modulating medication.  He has been taking gabapentin  as prescribed by his neurologist but this has not provided any relief of symptoms.  Denies any urinary symptoms or history of nephrolithiasis.  Denies any fever, nausea, vomiting.  He reports that pain is rated 12 on a 0-10 pain scale, described as a throbbing/aching/sharp, localized to specific area on his back/flank without radiation, no alleviating factors identified.    Past Medical History:  Diagnosis Date   Alcohol abuse    Multiple sclerosis    Neuromuscular disorder (HCC)    M.S.   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 Authorizing Provider  HYDROcodone-acetaminophen  (NORCO/VICODIN) 5-325 MG tablet Take 0.5-1  tablets by mouth 2 (two) times daily as needed for up to 3 days. 07/18/24 07/21/24 Yes Briget Shaheed K, PA-C  valACYclovir (VALTREX) 1000 MG tablet Take 1 tablet (1,000 mg total) by mouth 3 (three) times daily for 7 days. 07/18/24 07/25/24 Yes Tiegan Jambor K, PA-C  gabapentin  (NEURONTIN ) 300 MG capsule Take 2 capsules (600 mg total) by mouth 3 (three) times daily. 12/31/21   Sater, Charlie LABOR, MD  tamsulosin (FLOMAX) 0.4 MG CAPS capsule TAKE 1 CAPSULE BY MOUTH ONCE DAILY 30 MINUTES AFTER THE SAME MEAL EACH DAY 04/01/19   [provider]  Teriflunomide (AUBAGIO) 14 MG TABS Take by mouth. Unsure if he takes once or twice a day 02/10/19   [provider]    Family History Family History  Problem Relation Age of Onset   Dementia Mother    High blood pressure Mother    High blood pressure Father    Multiple sclerosis Cousin    Colon cancer Neg Hx    Colon polyps Neg Hx    Esophageal cancer Neg Hx    Rectal cancer Neg Hx    Stomach cancer Neg Hx     Social History Social History   Tobacco Use   Smoking status: Every Day    Current packs/day: 0.25    Types: Cigarettes   Smokeless tobacco: Never   Tobacco comments:  5 a day   Substance Use Topics   Alcohol use: Not Currently   Drug use: Not Currently    Types: Cocaine     Allergies   Patient has no known allergies.   Review of Systems Review of Systems  Constitutional:  Positive for activity change. Negative for appetite change, fatigue and fever.  Respiratory:  Negative for shortness of breath.   Cardiovascular:  Negative for chest pain.  Gastrointestinal:  Negative for nausea and vomiting.  Genitourinary:  Positive for flank pain. Negative for dysuria, frequency and urgency.  Musculoskeletal:  Positive for back pain. Negative for arthralgias and myalgias.  Skin:  Positive for rash.     Physical Exam Triage Vital Signs ED Triage Vitals [07/18/24 1038]  Encounter Vitals Group     BP (!) 148/87      Girls Systolic BP Percentile      Girls Diastolic BP Percentile      Boys Systolic BP Percentile      Boys Diastolic BP Percentile      Pulse Rate 61     Resp 16     Temp 98.2 F (36.8 C)     Temp Source Oral     SpO2 98 %     Weight      Height      Head Circumference      Peak Flow      Pain Score 10     Pain Loc      Pain Education      Exclude from Growth Chart    No data found.  Updated Vital Signs BP (!) 148/87 (BP Location: Left Arm)   Pulse 61   Temp 98.2 F (36.8 C) (Oral)   Resp 16   SpO2 98%   Visual Acuity Right Eye Distance:   Left Eye Distance:   Bilateral Distance:    Right Eye Near:   Left Eye Near:    Bilateral Near:     Physical Exam Vitals reviewed.  Constitutional:      General: He is awake.     Appearance: Normal appearance. He is well-developed. He is not ill-appearing.     Comments: Very pleasant male appears stated age in no acute distress sitting comfortably in exam room  HENT:     Head: Normocephalic and atraumatic.  Cardiovascular:     Rate and Rhythm: Normal rate and regular rhythm.     Heart sounds: Normal heart sounds, S1 normal and S2 normal. No murmur heard. Pulmonary:     Effort: Pulmonary effort is normal.     Breath sounds: Normal breath sounds. No stridor. No wheezing, rhonchi or rales.     Comments: Clear to auscultation bilaterally Abdominal:     Palpations: Abdomen is soft.     Tenderness: There is no abdominal tenderness. There is no right CVA tenderness or left CVA tenderness.  Musculoskeletal:     Cervical back: No tenderness or bony tenderness.     Thoracic back: Tenderness present. No spasms or bony tenderness.     Lumbar back: No tenderness or bony tenderness.       Back:     Comments: Back: No pain percussion of vertebrae.  No deformity or step-off noted.  Tender to palpation of left thoracic muscle without spasm.  Normal active range of motion.  Skin:    Findings: Rash present. Rash is macular, papular and  vesicular.     Comments: Erythematous maculopapular rash with several small vesicles noted in  left T7 distribution.  No bleeding or drainage noted.  Neurological:     Mental Status: He is alert.  Psychiatric:        Behavior: Behavior is cooperative.      UC Treatments / Results  Labs (all labs ordered are listed, but only abnormal results are displayed) Labs Reviewed  POCT URINE DIPSTICK - Abnormal; Notable for the following components:      Result Value   Color, UA orange (*)    Bilirubin, UA small (*)    Ketones, POC UA small (15) (*)    POC PROTEIN,UA =30 (*)    All other components within normal limits    EKG   Radiology No results found.  Procedures Procedures (including critical care time)  Medications Ordered in UC Medications - No data to display  Initial Impression / Assessment and Plan / UC Course  I have reviewed the triage vital signs and the nursing notes.  Pertinent labs & imaging results that were available during my care of the patient were reviewed by me and considered in my medical decision making (see chart for details).     Patient is well-appearing, afebrile, nontoxic, nontachycardic.  Urine was obtained given the flank pain that showed no significant blood and patient has no CVA tenderness so low suspicion for nephrolithiasis.  He does have a rash in the T7 dermatome.  He initially thought that this was because of the Salonpas patch but given it spreads through most of the dermatome and his clinical presentation I am more concerned for shingles.  Will treat with acyclovir 1000 mg 3 times daily for 1 week.  No indication for dose adjustment based on metabolic panel from 06/25/2023 with a creatinine of 1.08 calculated creatinine clearance of 89 mL/min.  He has tried Tylenol  and ibuprofen  without improvement of symptoms and is already on a appropriate dose of gabapentin  which is also not provided any relief.  Will provide a short course of hydrocodone for  pain relief given severity of pain and we discussed that this medication is both addictive and sedating so he should limit use is much as possible.  Reviewed [place]  controlled substance and with no inappropriate refills.  We discussed that this medication is sedating so he should not drive or drink alcohol with taking this medication.  If his symptoms do not improve or if anything worsens he is return for reevaluation.  Strict return precautions given.  Excuse note provided.  Final Clinical Impressions(s) / UC Diagnoses   Final diagnoses:  Left flank pain  Herpes zoster without complication     Discharge Instructions      I believe that you have shingles.  Please start Valtrex 3 times a day to help your body fight the shingles virus.  I have called in hydrocodone that you can take up to 2 times a day.  This will make you sleepy so do not drive or drink alcohol with taking it.  This medication is addictive and sedating so we cannot provide any refills and you should use it as infrequently as possible.  You can use Tylenol  and ibuprofen  for additional pain relief.  Continue your gabapentin  as prescribed.  If you develop any fever, spread of rash, increasing pain, difficulty urinating, blood in your urine, nausea/vomiting you need to be seen immediately.     ED Prescriptions     Medication Sig Dispense Auth. Provider   valACYclovir (VALTREX) 1000 MG tablet Take 1 tablet (1,000 mg total) by  mouth 3 (three) times daily for 7 days. 21 tablet Sohan Potvin K, PA-C   HYDROcodone-acetaminophen  (NORCO/VICODIN) 5-325 MG tablet Take 0.5-1 tablets by mouth 2 (two) times daily as needed for up to 3 days. 6 tablet Viriginia Amendola K, PA-C      I have reviewed the PDMP during this encounter.   Sherrell Rocky POUR, PA-C 07/18/24 1132

## 2024-07-18 NOTE — Discharge Instructions (Signed)
 I believe that you have shingles.  Please start Valtrex 3 times a day to help your body fight the shingles virus.  I have called in hydrocodone that you can take up to 2 times a day.  This will make you sleepy so do not drive or drink alcohol with taking it.  This medication is addictive and sedating so we cannot provide any refills and you should use it as infrequently as possible.  You can use Tylenol  and ibuprofen  for additional pain relief.  Continue your gabapentin  as prescribed.  If you develop any fever, spread of rash, increasing pain, difficulty urinating, blood in your urine, nausea/vomiting you need to be seen immediately.
